# Patient Record
Sex: Male | Born: 1970 | Race: Black or African American | Hispanic: No | Marital: Single | Smoking: Never smoker
Health system: Southern US, Community
[De-identification: ages and names within clinical notes are randomized; demographics above are authoritative.]

## PROBLEM LIST (undated history)

## (undated) DIAGNOSIS — D696 Thrombocytopenia, unspecified: Secondary | ICD-10-CM

## (undated) HISTORY — PX: APPENDECTOMY: SHX54

---

## 2000-07-16 ENCOUNTER — Emergency Department (HOSPITAL_COMMUNITY): Admission: EM | Admit: 2000-07-16 | Discharge: 2000-07-16 | Payer: Self-pay | Admitting: *Deleted

## 2000-07-18 ENCOUNTER — Emergency Department (HOSPITAL_COMMUNITY): Admission: EM | Admit: 2000-07-18 | Discharge: 2000-07-18 | Payer: Self-pay | Admitting: Emergency Medicine

## 2005-09-22 ENCOUNTER — Encounter: Admission: RE | Admit: 2005-09-22 | Discharge: 2005-09-22 | Payer: Self-pay | Admitting: Internal Medicine

## 2006-05-12 ENCOUNTER — Ambulatory Visit (HOSPITAL_BASED_OUTPATIENT_CLINIC_OR_DEPARTMENT_OTHER): Admission: RE | Admit: 2006-05-12 | Discharge: 2006-05-12 | Payer: Self-pay | Admitting: Urology

## 2006-07-31 IMAGING — US US SCROTUM
1 series · 14 of 25 positions shown · non-contrast
Comparison: None.

CLINICAL DATA: Right scrotal mass.
 SCROTAL ULTRASOUND:
TECHNIQUE: Complete ultrasound examination of the testicles, epididymis, and other scrotal structures was performed.

[Series 1: unknown · 0.10mm/px · 14 of 43 slices shown]
[im 1/43]
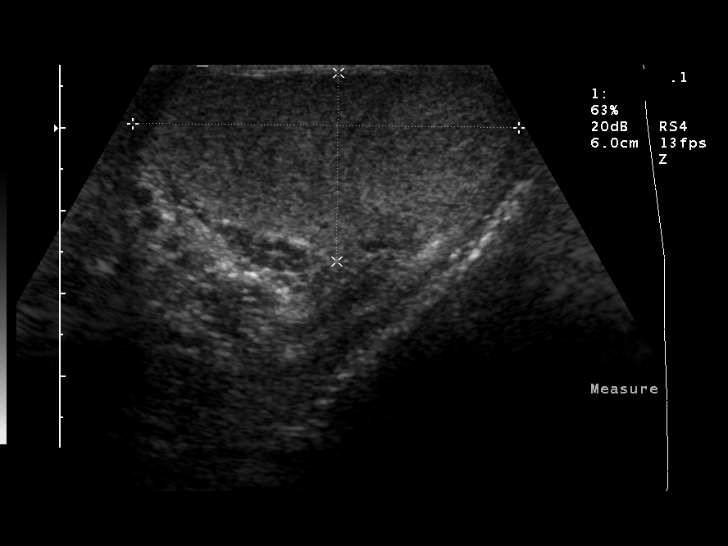
[im 4/43]
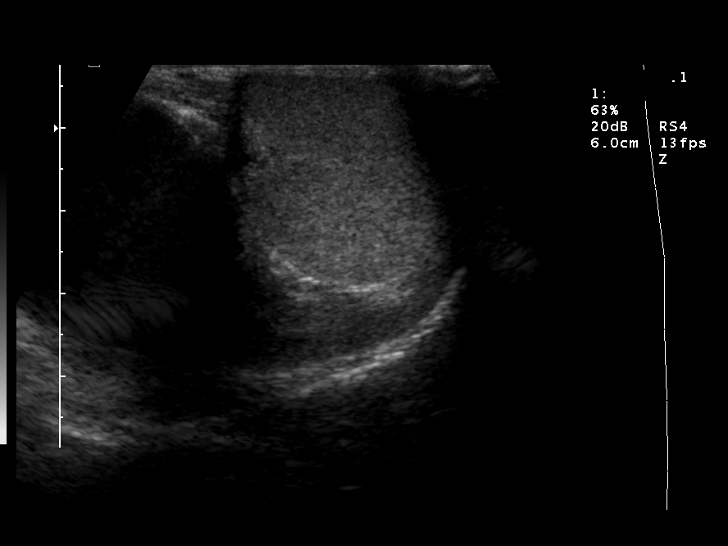
[im 8/43]
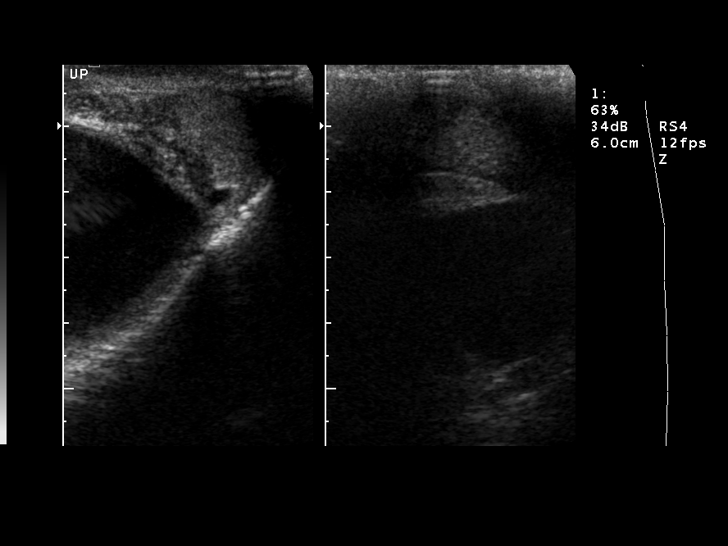
[im 11/43]
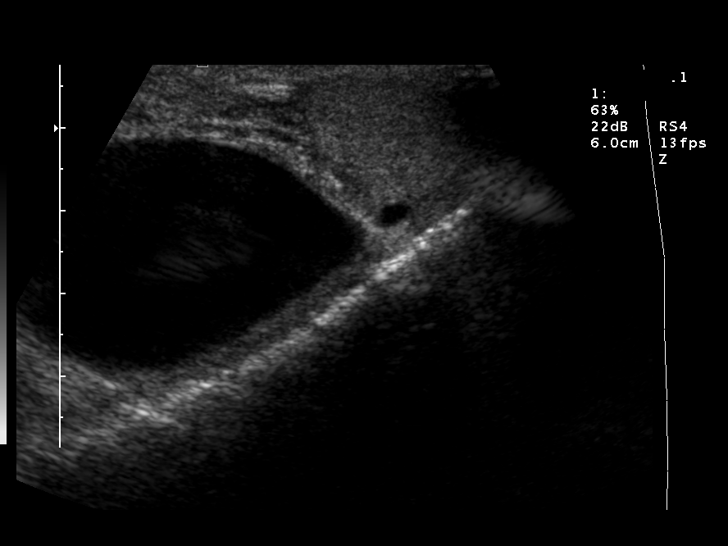
[im 15/43]
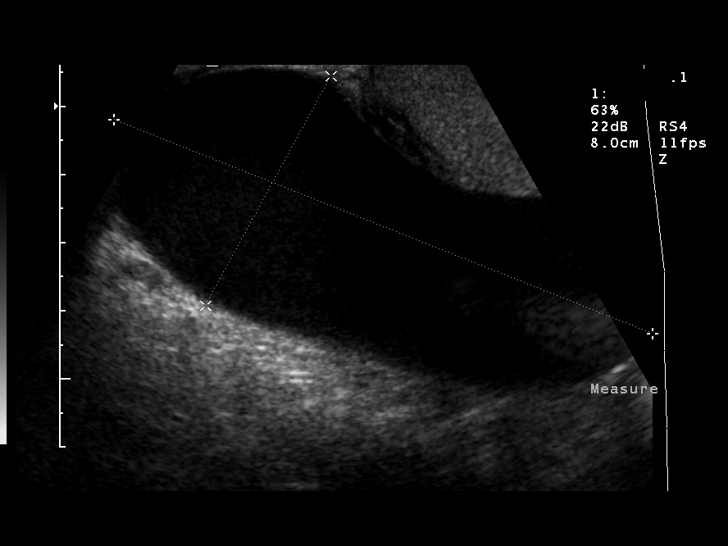
[im 16/43]
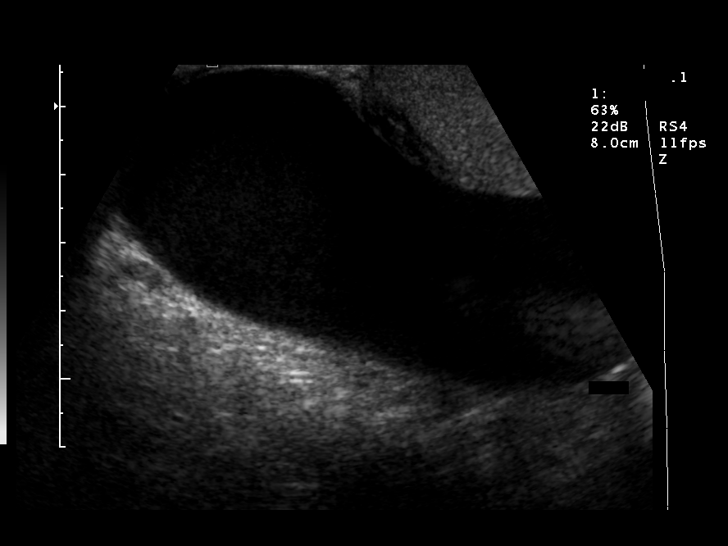
[im 20/43]
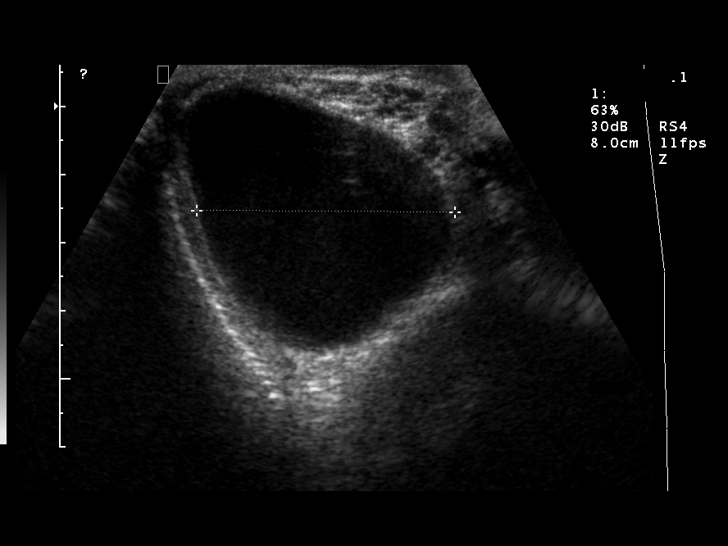
[im 23/43]
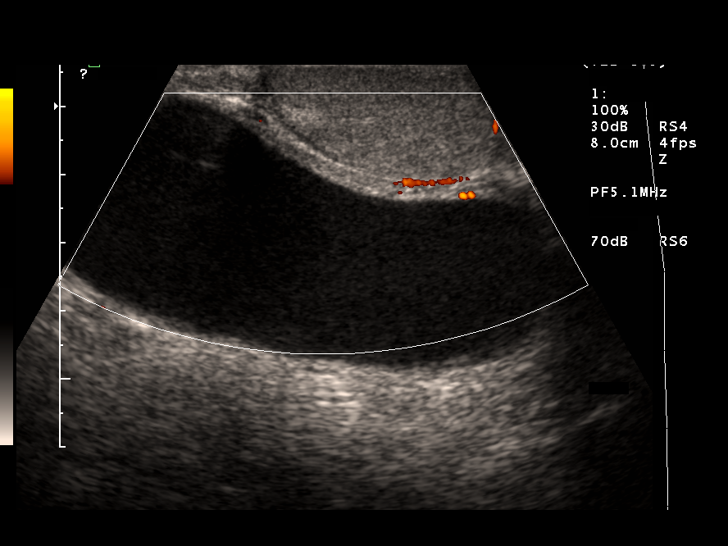
[im 27/43]
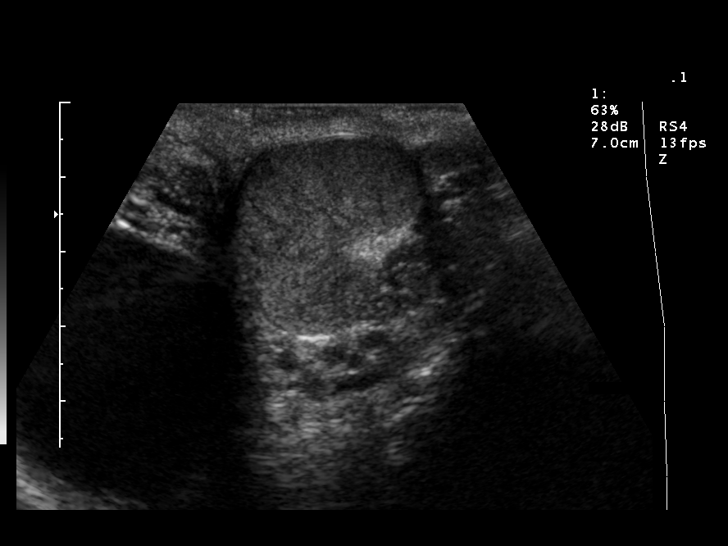
[im 29/43]
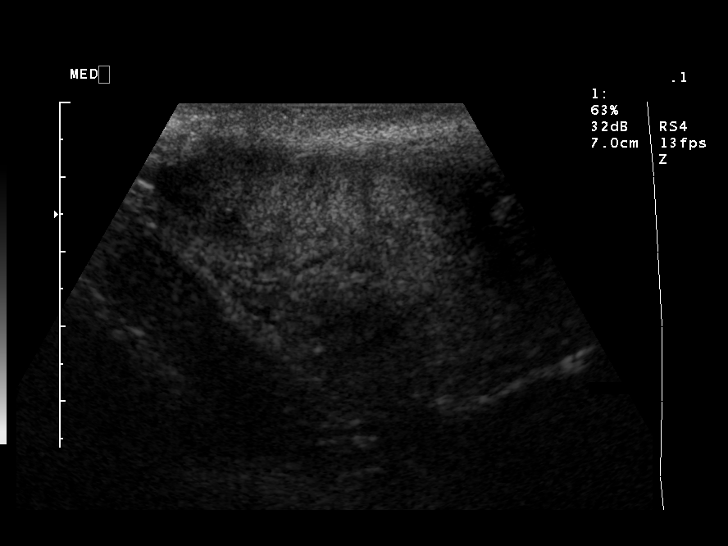
[im 32/43]
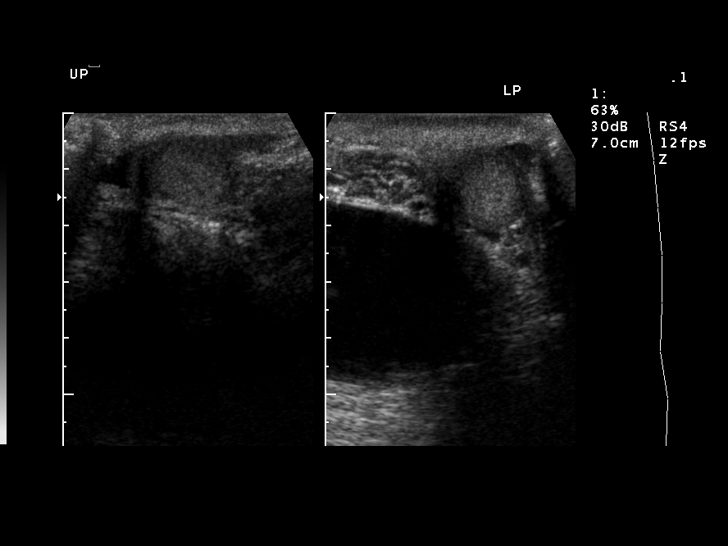
[im 36/43]
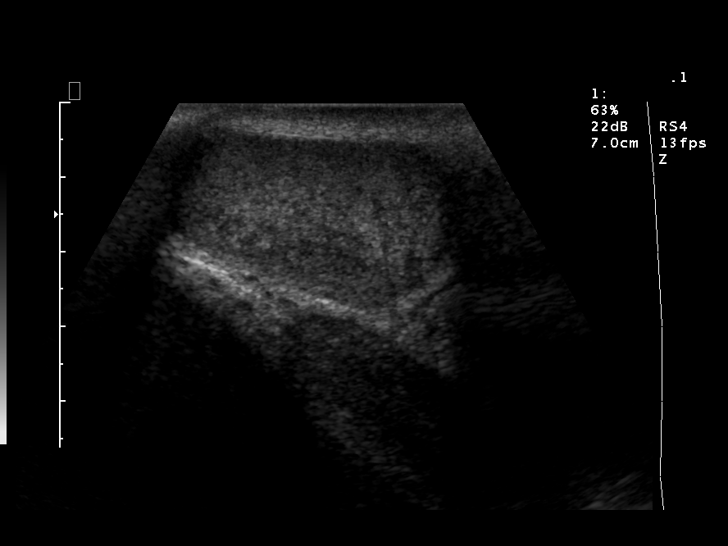
[im 39/43]
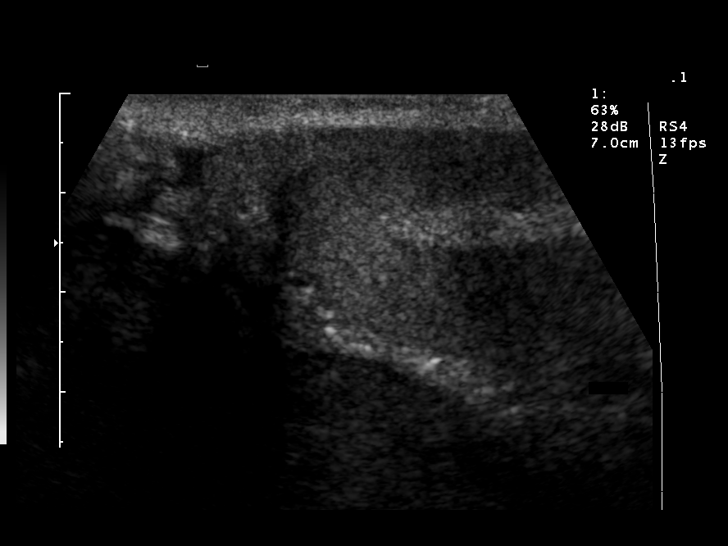
[im 43/43]
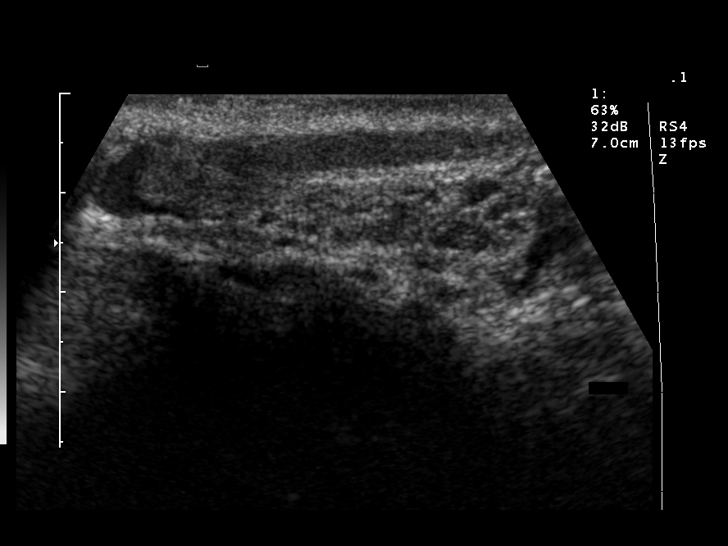

[14 of 25 positions shown; findings below may reference images not displayed]

FINDINGS: The testicles are within normal limits sonographically.  There is symmetrical color Doppler flow.  On the right, there is a large cyst within the scrotal sac measuring 9.0 x 4.1 x 3.8 cm.  The cyst appears to be intimately related to the epididymis.  There is a second smaller cyst measuring only a few millimeters within the epididymis.  The large fluid collection contains echogenic material.  There is no hypervascularity of this lesion.  The most likely diagnosis is a large epididymal cyst/spermatocele.  The left epididymis is normal.  No hydrocele or varicocele.
IMPRESSION: 1.  Large cystic lesion in the right scrotal sac.  The findings are most consistent with a large epididymal cyst/spermatocele.
 2.  Otherwise normal exam.

## 2013-01-09 ENCOUNTER — Other Ambulatory Visit: Payer: Self-pay | Admitting: Internal Medicine

## 2013-01-09 DIAGNOSIS — D179 Benign lipomatous neoplasm, unspecified: Secondary | ICD-10-CM

## 2013-01-18 ENCOUNTER — Emergency Department (HOSPITAL_COMMUNITY)
Admission: EM | Admit: 2013-01-18 | Discharge: 2013-01-18 | Disposition: A | Discharge status: Home / Self Care | Payer: 59 | Source: Home / Self Care | Attending: Family Medicine | Admitting: Family Medicine

## 2013-01-18 ENCOUNTER — Encounter (HOSPITAL_COMMUNITY): Payer: Self-pay | Admitting: Emergency Medicine

## 2013-01-18 DIAGNOSIS — Z711 Person with feared health complaint in whom no diagnosis is made: Secondary | ICD-10-CM

## 2013-01-18 DIAGNOSIS — T6391XA Toxic effect of contact with unspecified venomous animal, accidental (unintentional), initial encounter: Secondary | ICD-10-CM

## 2013-01-18 NOTE — ED Provider Notes (Signed)
   History    CSN: [id number] Arrival date & time 01/18/13  1129  First MD Initiated Contact with Patient 01/18/13 1325     Chief Complaint  Patient presents with  . Snake Bite   (Consider location/radiation/quality/duration/timing/severity/associated sxs/prior Treatment) HPI Comments: 42 year old male with no significant past medical history. Here concerned about a possible snake bite in his right lower leg. Patient stated he was getting his grill already today when he fell like a mosquito bites in his right lower leg liver short after he sold a snake coming out of the area where he had been. There no skin rashes or wounds. This event occurred at around 10 AM this morning (about 4 hours ago)   History reviewed. No pertinent past medical history. Past Surgical History  Procedure Laterality Date  . Appendectomy     No family history on file. History  Substance Use Topics  . Smoking status: Never Smoker   . Smokeless tobacco: Not on file  . Alcohol Use: No    Review of Systems  Constitutional: Negative for fever, chills, diaphoresis, appetite change and fatigue.  Respiratory: Negative for cough, shortness of breath and wheezing.   Skin: Negative for rash and wound.  Neurological: Negative for dizziness and headaches.  All other systems reviewed and are negative.    Allergies  Review of patient's allergies indicates no known allergies.  Home Medications  No current outpatient prescriptions on file. BP 132/86  Pulse 62  Temp(Src) 97.9 F (36.6 C) (Oral)  Resp 16 Physical Exam  Nursing note and vitals reviewed. Constitutional: He is oriented to person, place, and time. He appears well-developed and well-nourished. No distress.  HENT:  Head: Normocephalic and atraumatic.  Mouth/Throat: Oropharynx is clear and moist.  No pharyngeal edema.  Eyes: Conjunctivae and EOM are normal. Pupils are equal, round, and reactive to light. No scleral icterus.  Neck: Neck supple. No JVD  present.  Cardiovascular: Normal rate, regular rhythm, normal heart sounds and intact distal pulses.  Exam reveals no gallop and no friction rub.   No murmur heard. Pulmonary/Chest: Effort normal and breath sounds normal. No respiratory distress. He has no wheezes. He has no rales. He exhibits no tenderness.  Abdominal: Soft. Bowel sounds are normal. He exhibits no distension. There is no tenderness.  Lymphadenopathy:    He has no cervical adenopathy.  Neurological: He is alert and oriented to person, place, and time.  Skin: No rash noted. He is not diaphoretic. No erythema.  No wounds or skin brakes    ED Course  Procedures (including critical care time) Labs Reviewed - No data to display No results found. 1. Physically well but worried     MDM  Normal exam. No skin lesions, rashes or wounds. No prescriptions. Red flags that should prompt his return to medical attention discussed with patient and provided in writing.  Sharin Grave, MD 01/20/13 [phone]

## 2013-01-18 NOTE — ED Notes (Signed)
Asked to assess this patient on arrival for snake bite. Patient states he felt a insect bite type sensation in yard, and then saw a snake slithering away from him . Did NOT WITENSS SNAKE BITING HIM . No break in skin or swelling in extremity noted on inspection. NAD at present

## 2013-01-18 NOTE — ED Notes (Signed)
Patient reports being outside today, walked into a grassy area and then felt what he thought was a mosquito bite on right, lateral ankle .  A short time later he noticed a snake coming out of area he had been in when he felt mosquito bite.  Patient concerned he was bit by a snake.  No visible wound to ankle

## 2013-01-22 ENCOUNTER — Ambulatory Visit
Admission: RE | Admit: 2013-01-22 | Discharge: 2013-01-22 | Disposition: A | Discharge status: Home / Self Care | Payer: 59 | Source: Ambulatory Visit | Attending: Internal Medicine | Admitting: Internal Medicine

## 2013-01-22 DIAGNOSIS — D179 Benign lipomatous neoplasm, unspecified: Secondary | ICD-10-CM

## 2013-01-23 ENCOUNTER — Other Ambulatory Visit: Payer: 59

## 2014-06-15 ENCOUNTER — Encounter (HOSPITAL_COMMUNITY): Payer: Self-pay | Admitting: Emergency Medicine

## 2014-06-15 ENCOUNTER — Encounter: Payer: 59 | Admitting: Hematology

## 2014-06-15 ENCOUNTER — Inpatient Hospital Stay (HOSPITAL_COMMUNITY)
Admission: EM | Admit: 2014-06-15 | Discharge: 2014-06-23 | DRG: 546 | Disposition: A | Discharge status: Home / Self Care | Payer: 59 | Attending: Internal Medicine | Admitting: Internal Medicine

## 2014-06-15 DIAGNOSIS — R509 Fever, unspecified: Secondary | ICD-10-CM

## 2014-06-15 DIAGNOSIS — D509 Iron deficiency anemia, unspecified: Secondary | ICD-10-CM | POA: Insufficient documentation

## 2014-06-15 DIAGNOSIS — N179 Acute kidney failure, unspecified: Secondary | ICD-10-CM | POA: Diagnosis present

## 2014-06-15 DIAGNOSIS — R112 Nausea with vomiting, unspecified: Secondary | ICD-10-CM | POA: Diagnosis not present

## 2014-06-15 DIAGNOSIS — D696 Thrombocytopenia, unspecified: Secondary | ICD-10-CM | POA: Diagnosis present

## 2014-06-15 DIAGNOSIS — Z452 Encounter for adjustment and management of vascular access device: Secondary | ICD-10-CM

## 2014-06-15 DIAGNOSIS — M6282 Rhabdomyolysis: Secondary | ICD-10-CM | POA: Diagnosis not present

## 2014-06-15 DIAGNOSIS — M3119 Other thrombotic microangiopathy: Secondary | ICD-10-CM

## 2014-06-15 DIAGNOSIS — Z992 Dependence on renal dialysis: Secondary | ICD-10-CM

## 2014-06-15 DIAGNOSIS — Z79899 Other long term (current) drug therapy: Secondary | ICD-10-CM

## 2014-06-15 DIAGNOSIS — M311 Thrombotic microangiopathy: Principal | ICD-10-CM | POA: Diagnosis present

## 2014-06-15 LAB — CBC WITH DIFFERENTIAL/PLATELET
BASOS ABS: 0 10*3/uL (ref 0.0–0.1)
Basophils Relative: 0 % (ref 0–1)
EOS ABS: 0 10*3/uL (ref 0.0–0.7)
Eosinophils Relative: 0 % (ref 0–5)
HEMATOCRIT: 32.5 % — AB (ref 39.0–52.0)
Hemoglobin: 10.9 g/dL — ABNORMAL LOW (ref 13.0–17.0)
Lymphocytes Relative: 28 % (ref 12–46)
Lymphs Abs: 2.3 10*3/uL (ref 0.7–4.0)
MCH: 26.7 pg (ref 26.0–34.0)
MCHC: 33.5 g/dL (ref 30.0–36.0)
MCV: 79.7 fL (ref 78.0–100.0)
Monocytes Absolute: 1.1 10*3/uL — ABNORMAL HIGH (ref 0.1–1.0)
Monocytes Relative: 13 % — ABNORMAL HIGH (ref 3–12)
NEUTROS ABS: 4.7 10*3/uL (ref 1.7–7.7)
Neutrophils Relative %: 59 % (ref 43–77)
Platelets: 9 10*3/uL — CL (ref 150–400)
RBC: 4.08 MIL/uL — ABNORMAL LOW (ref 4.22–5.81)
RDW: 15.2 % (ref 11.5–15.5)
WBC: 8.1 10*3/uL (ref 4.0–10.5)

## 2014-06-15 LAB — URINALYSIS, ROUTINE W REFLEX MICROSCOPIC
Bilirubin Urine: NEGATIVE
GLUCOSE, UA: NEGATIVE mg/dL
Ketones, ur: NEGATIVE mg/dL
LEUKOCYTES UA: NEGATIVE
Nitrite: NEGATIVE
Protein, ur: 100 mg/dL — AB
SPECIFIC GRAVITY, URINE: 1.014 (ref 1.005–1.030)
Urobilinogen, UA: 0.2 mg/dL (ref 0.0–1.0)
pH: 5.5 (ref 5.0–8.0)

## 2014-06-15 LAB — HEPATIC FUNCTION PANEL
ALT: 57 U/L — ABNORMAL HIGH (ref 0–53)
AST: 165 U/L — AB (ref 0–37)
Albumin: 4 g/dL (ref 3.5–5.2)
Alkaline Phosphatase: 33 U/L — ABNORMAL LOW (ref 39–117)
BILIRUBIN DIRECT: 0.4 mg/dL — AB (ref 0.0–0.3)
BILIRUBIN INDIRECT: 2.7 mg/dL — AB (ref 0.3–0.9)
BILIRUBIN TOTAL: 3.1 mg/dL — AB (ref 0.3–1.2)
Total Protein: 7.3 g/dL (ref 6.0–8.3)

## 2014-06-15 LAB — FIBRINOGEN: Fibrinogen: 472 mg/dL (ref 204–475)

## 2014-06-15 LAB — BASIC METABOLIC PANEL
Anion gap: 14 (ref 5–15)
BUN: 49 mg/dL — AB (ref 6–23)
CALCIUM: 9.7 mg/dL (ref 8.4–10.5)
CO2: 26 meq/L (ref 19–32)
Chloride: 100 mEq/L (ref 96–112)
Creatinine, Ser: 2.68 mg/dL — ABNORMAL HIGH (ref 0.50–1.35)
GFR calc Af Amer: 32 mL/min — ABNORMAL LOW (ref >90)
GFR, EST NON AFRICAN AMERICAN: 28 mL/min — AB (ref >90)
GLUCOSE: 103 mg/dL — AB (ref 70–99)
Potassium: 3.8 mEq/L (ref 3.7–5.3)
Sodium: 140 mEq/L (ref 137–147)

## 2014-06-15 LAB — CBC
HEMATOCRIT: 33.7 % — AB (ref 39.0–52.0)
Hemoglobin: 11.2 g/dL — ABNORMAL LOW (ref 13.0–17.0)
MCH: 26.5 pg (ref 26.0–34.0)
MCHC: 33.2 g/dL (ref 30.0–36.0)
MCV: 79.7 fL (ref 78.0–100.0)
PLATELETS: 9 10*3/uL — AB (ref 150–400)
RBC: 4.23 MIL/uL (ref 4.22–5.81)
RDW: 15.4 % (ref 11.5–15.5)
WBC: 7.7 10*3/uL (ref 4.0–10.5)

## 2014-06-15 LAB — CK: Total CK: 5214 U/L — ABNORMAL HIGH (ref 7–232)

## 2014-06-15 LAB — PROTIME-INR
INR: 1.08 (ref 0.00–1.49)
Prothrombin Time: 14.1 seconds (ref 11.6–15.2)

## 2014-06-15 LAB — D-DIMER, QUANTITATIVE: D-Dimer, Quant: 2.89 ug/mL-FEU — ABNORMAL HIGH (ref 0.00–0.48)

## 2014-06-15 LAB — URINE MICROSCOPIC-ADD ON

## 2014-06-15 LAB — SEDIMENTATION RATE: SED RATE: 28 mm/h — AB (ref 0–16)

## 2014-06-15 LAB — APTT: APTT: 29 s (ref 24–37)

## 2014-06-15 LAB — LACTATE DEHYDROGENASE: LDH: 1528 U/L — AB (ref 94–250)

## 2014-06-15 MED ORDER — ONDANSETRON HCL 4 MG/2ML IJ SOLN
4.0000 mg | Freq: Once | INTRAMUSCULAR | Status: AC
  Administered 2014-06-15: 4 mg via INTRAVENOUS
  Filled 2014-06-15: qty 2

## 2014-06-15 MED ORDER — ACETAMINOPHEN 325 MG PO TABS
650.0000 mg | ORAL_TABLET | Freq: Four times a day (QID) | ORAL | Status: DC | PRN
  Administered 2014-06-15: 650 mg via ORAL
  Filled 2014-06-15: qty 2

## 2014-06-15 MED ORDER — PANTOPRAZOLE SODIUM 40 MG IV SOLR
40.0000 mg | INTRAVENOUS | Status: DC
  Administered 2014-06-15 – 2014-06-19 (×5): 40 mg via INTRAVENOUS
  Filled 2014-06-15 (×6): qty 40

## 2014-06-15 MED ORDER — SUCRALFATE 1 GM/10ML PO SUSP
1.0000 g | Freq: Four times a day (QID) | ORAL | Status: DC
  Administered 2014-06-15 – 2014-06-23 (×28): 1 g via ORAL
  Filled 2014-06-15 (×34): qty 10

## 2014-06-15 MED ORDER — SODIUM CHLORIDE 0.9 % IV BOLUS (SEPSIS)
1000.0000 mL | Freq: Once | INTRAVENOUS | Status: AC
  Administered 2014-06-15: 1000 mL via INTRAVENOUS

## 2014-06-15 MED ORDER — ACETAMINOPHEN 650 MG RE SUPP: 650.0000 mg | Freq: Four times a day (QID) | RECTAL | Status: DC | PRN

## 2014-06-15 NOTE — Progress Notes (Signed)
[place] NOTE  Patient Care Team: Philis Fendt, MD as PCP - General (Internal Medicine)  CHIEF COMPLAINTS/PURPOSE OF CONSULTATION:  Thrombocytopenia   HISTORY OF PRESENTING ILLNESS:  Kelly Bright 43 y.o. male w/o significant past medical history, who presented to ED with fatigue and extremity numbness for one day. I was called by the ED physician to evaluate his severe thrombocytopenia.  He states he has been having mild anemia over a year, and he has been taking oral iron which was recommended by his primary care physician. He took 2 pills of over-the-counter supplements yesterday, which contains high-dose caffeine, and then went to [employer] for workout. He started his feeling fatigue, lightheaded, and intermittent numbness on fingers and toes, headaches, and nausea and vomiting last night. He had some chills but no fever. He noticed some bloody blister in the mouth this morning and presented to the emergency room today. His CBC showed platelet count 9k, Hemoglobin 10.9, and the peripheral smear showed positive schistocytes. He denies any episodes of confusion, or local weakness.  MEDICAL HISTORY:  No past medical history on file.  SURGICAL HISTORY: Past Surgical History  Procedure Laterality Date  . Appendectomy      SOCIAL HISTORY: History   Social History  . Marital Status: Single    Spouse Name: N/A    Number of Children: N/A  . Years of Education: N/A   Occupational History  . Not on file.   Social History Main Topics  . Smoking status: Never Smoker   . Smokeless tobacco: Not on file  . Alcohol Use: No  . Drug Use: No  . Sexual Activity: Not on file   Other Topics Concern  . Not on file   Social History Narrative    FAMILY HISTORY: No family history on file.  ALLERGIES:  has No Known Allergies.  MEDICATIONS:  No current facility-administered medications for this visit.   Current Outpatient Prescriptions  Medication Sig Dispense  Refill  . Ascorbic Acid (VITAMIN C) 500 MG CHEW Chew 1 tablet by mouth daily.    . cholecalciferol (VITAMIN D) 1000 UNITS tablet Take 1,000 Units by mouth daily.    Marland Kitchen glucosamine-chondroitin 500-400 MG tablet Take 1 tablet by mouth 2 (two) times daily.    . Iron TABS Take 1 tablet by mouth daily.    . Nutritional Supplements (ANTIOXIDANTS PO) Take 2 tablets by mouth daily as needed (nutritional purposes).     Facility-Administered Medications Ordered in Other Visits  Medication Dose Route Frequency Provider Last Rate Last Dose  . acetaminophen (TYLENOL) tablet 650 mg  650 mg Oral Q6H PRN Theressa Millard, MD   650 mg at 06/15/14 2334   Or  . acetaminophen (TYLENOL) suppository 650 mg  650 mg Rectal Q6H PRN Theressa Millard, MD      . pantoprazole (PROTONIX) injection 40 mg  40 mg Intravenous Q24H Theressa Millard, MD   40 mg at 06/15/14 2258  . sucralfate (CARAFATE) 1 GM/10ML suspension 1 g  1 g Oral 4 times per day Theressa Millard, MD   1 g at 06/15/14 2256    REVIEW OF SYSTEMS:   Constitutional: Denies fevers, chills or abnormal night sweats, positive for fatigue Eyes: Denies blurriness of vision, double vision or watery eyes Ears, nose, mouth, throat, and face: Denies mucositis or sore throat Respiratory: Denies cough, dyspnea or wheezes Cardiovascular: Denies palpitation, chest discomfort or lower extremity swelling Gastrointestinal:  Positive for intermittent nausea and  vomiting, no heartburn or change in bowel habits Skin: Denies abnormal skin rashes Lymphatics: Denies new lymphadenopathy or easy bruising Neurological: Intermittent numbness of feet and hands, no tingling or new weaknesses Behavioral/Psych: Mood is stable, no new changes  All other systems were reviewed with the patient and are negative.  PHYSICAL EXAMINATION: His vital sign are normal in the ED GENERAL:alert, no distress and comfortable SKIN: skin color, texture, turgor are normal, no rashes or  significant lesions EYES: normal, conjunctiva are pink and non-injected, sclera clear OROPHARYNX:no exudate, no erythema and lips, buccal mucosa, and tongue normal, I feel bloody blister on the bilateral mucousa NECK: supple, thyroid normal size, non-tender, without nodularity LYMPH:  no palpable lymphadenopathy in the cervical, axillary or inguinal LUNGS: clear to auscultation and percussion with normal breathing effort HEART: regular rate & rhythm and no murmurs and no lower extremity edema ABDOMEN:abdomen soft, non-tender and normal bowel sounds Musculoskeletal:no cyanosis of digits and no clubbing  PSYCH: alert & oriented x 3 with fluent speech NEURO: no focal motor/sensory deficits  LABORATORY DATA:  I have reviewed the data as listed Recent Results (from the past 2160 hour(s))  CBC with Differential     Status: Abnormal   Collection Time: 06/15/14  6:39 PM  Result Value Ref Range   WBC 8.1 4.0 - 10.5 K/uL   RBC 4.08 (L) 4.22 - 5.81 MIL/uL   Hemoglobin 10.9 (L) 13.0 - 17.0 g/dL   HCT 32.5 (L) 39.0 - 52.0 %   MCV 79.7 78.0 - 100.0 fL   MCH 26.7 26.0 - 34.0 pg   MCHC 33.5 30.0 - 36.0 g/dL   RDW 15.2 11.5 - 15.5 %   Platelets 9 (LL) 150 - 400 K/uL    Comment: RESULT REPEATED AND VERIFIED SPECIMEN CHECKED FOR CLOTS PLATELET COUNT CONFIRMED BY SMEAR CRITICAL RESULT CALLED TO, READ BACK BY AND VERIFIED WITH: NEILSON,T AT 1945 ON [id number] BY HOOKER,B    Neutrophils Relative % 59 43 - 77 %   Lymphocytes Relative 28 12 - 46 %   Monocytes Relative 13 (H) 3 - 12 %   Eosinophils Relative 0 0 - 5 %   Basophils Relative 0 0 - 1 %   Neutro Abs 4.7 1.7 - 7.7 K/uL   Lymphs Abs 2.3 0.7 - 4.0 K/uL   Monocytes Absolute 1.1 (H) 0.1 - 1.0 K/uL   Eosinophils Absolute 0.0 0.0 - 0.7 K/uL   Basophils Absolute 0.0 0.0 - 0.1 K/uL   RBC Morphology SCHISTOCYTES PRESENT (2-5/hpf)     Comment: POLYCHROMASIA PRESENT   WBC Morphology MILD LEFT SHIFT (1-5% METAS, OCC MYELO, OCC BANDS)   Basic metabolic  panel     Status: Abnormal   Collection Time: 06/15/14  6:39 PM  Result Value Ref Range   Sodium 140 137 - 147 mEq/L   Potassium 3.8 3.7 - 5.3 mEq/L   Chloride 100 96 - 112 mEq/L   CO2 26 19 - 32 mEq/L   Glucose, Bld 103 (H) 70 - 99 mg/dL   BUN 49 (H) 6 - 23 mg/dL   Creatinine, Ser 2.68 (H) 0.50 - 1.35 mg/dL   Calcium 9.7 8.4 - 10.5 mg/dL   GFR calc non Af Amer 28 (L) >90 mL/min   GFR calc Af Amer 32 (L) >90 mL/min    Comment: (NOTE) The eGFR has been calculated using the CKD EPI equation. This calculation has not been validated in all clinical situations. eGFR's persistently <90 mL/min signify possible Chronic Kidney Disease.  Anion gap 14 5 - 15  CK     Status: Abnormal   Collection Time: 06/15/14  6:39 PM  Result Value Ref Range   Total CK 5214 (H) 7 - 232 U/L  Urinalysis, Routine w reflex microscopic     Status: Abnormal   Collection Time: 06/15/14  7:39 PM  Result Value Ref Range   Color, Urine AMBER (A) YELLOW    Comment: BIOCHEMICALS MAY BE AFFECTED BY COLOR   APPearance CLEAR CLEAR   Specific Gravity, Urine 1.014 1.005 - 1.030   pH 5.5 5.0 - 8.0   Glucose, UA NEGATIVE NEGATIVE mg/dL   Hgb urine dipstick LARGE (A) NEGATIVE   Bilirubin Urine NEGATIVE NEGATIVE   Ketones, ur NEGATIVE NEGATIVE mg/dL   Protein, ur 100 (A) NEGATIVE mg/dL   Urobilinogen, UA 0.2 0.0 - 1.0 mg/dL   Nitrite NEGATIVE NEGATIVE   Leukocytes, UA NEGATIVE NEGATIVE  Urine microscopic-add on     Status: None   Collection Time: 06/15/14  7:39 PM  Result Value Ref Range   Squamous Epithelial / LPF RARE RARE   WBC, UA 0-2 <3 WBC/hpf   RBC / HPF 0-2 <3 RBC/hpf   Urine-Other AMORPHOUS URATES/PHOSPHATES   Hepatic function panel     Status: Abnormal   Collection Time: 06/15/14  9:11 PM  Result Value Ref Range   Total Protein 7.3 6.0 - 8.3 g/dL   Albumin 4.0 3.5 - 5.2 g/dL   AST 165 (H) 0 - 37 U/L   ALT 57 (H) 0 - 53 U/L   Alkaline Phosphatase 33 (L) 39 - 117 U/L   Total Bilirubin 3.1 (H) 0.3 -  1.2 mg/dL   Bilirubin, Direct 0.4 (H) 0.0 - 0.3 mg/dL   Indirect Bilirubin 2.7 (H) 0.3 - 0.9 mg/dL  Sedimentation rate     Status: Abnormal   Collection Time: 06/15/14  9:11 PM  Result Value Ref Range   Sed Rate 28 (H) 0 - 16 mm/hr  Lactate dehydrogenase     Status: Abnormal   Collection Time: 06/15/14  9:11 PM  Result Value Ref Range   LDH 1528 (H) 94 - 250 U/L  D-dimer, quantitative     Status: Abnormal   Collection Time: 06/15/14  9:11 PM  Result Value Ref Range   D-Dimer, Quant 2.89 (H) 0.00 - 0.48 ug/mL-FEU    Comment:        AT THE INHOUSE ESTABLISHED CUTOFF VALUE OF 0.48 ug/mL FEU, THIS ASSAY HAS BEEN DOCUMENTED IN THE LITERATURE TO HAVE A SENSITIVITY AND NEGATIVE PREDICTIVE VALUE OF AT LEAST 98 TO 99%.  THE TEST RESULT SHOULD BE CORRELATED WITH AN ASSESSMENT OF THE CLINICAL PROBABILITY OF DVT / VTE.   Fibrinogen     Status: None   Collection Time: 06/15/14  9:11 PM  Result Value Ref Range   Fibrinogen 472 204 - 475 mg/dL  Protime-INR     Status: None   Collection Time: 06/15/14  9:11 PM  Result Value Ref Range   Prothrombin Time 14.1 11.6 - 15.2 seconds   INR 1.08 0.00 - 1.49  APTT     Status: None   Collection Time: 06/15/14  9:11 PM  Result Value Ref Range   aPTT 29 24 - 37 seconds  CBC     Status: Abnormal   Collection Time: 06/15/14 10:30 PM  Result Value Ref Range   WBC 7.7 4.0 - 10.5 K/uL   RBC 4.23 4.22 - 5.81 MIL/uL   Hemoglobin 11.2 (L)  13.0 - 17.0 g/dL   HCT 33.7 (L) 39.0 - 52.0 %   MCV 79.7 78.0 - 100.0 fL   MCH 26.5 26.0 - 34.0 pg   MCHC 33.2 30.0 - 36.0 g/dL   RDW 15.4 11.5 - 15.5 %   Platelets 9 (LL) 150 - 400 K/uL    Comment: REPEATED TO VERIFY CRITICAL VALUE NOTED.  VALUE IS CONSISTENT WITH PREVIOUSLY REPORTED AND CALLED VALUE.     RADIOGRAPHIC STUDIES:  ASSESSMENT & PLAN:  43 year old African-American male, without significant past medical history, presented with fatigue, nausea vomiting, headache and extremity numbness, and severe  thrombocytopenia and mild anemia.  1. 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 Giving the severe some cytopenia, anemia with lab evidence of hemolysis ( including high LDH and elevated indirect bilirubin, his haptoglobin is still pending), acute renal failure, neuro symptoms (headache and extremity numbness), this is highly suspicious for TTP. -Please send for ADAMTS 13 level and inhibitor tomorrow -The other differential including DIC, and other etiology of MAHA. He has normal PT, PTT, fibrinogen levels, and d-dimer is not remarkably elevated, which makes DIC is much less likely.  2. Rhabdomyolysis  -Possibly related to his vigorous exercise yesterday  Recommendation  #1 I have spent spoken with [facility] physician Dr. Colin Rhein regarding transfer him to [facility] for catheter placement tonight, and initiating plasma exchange tomorrow morning. Dr. Colin Rhein has contacted renal service, and is a wall started plasmic exchange tomorrow.  #2 start methylprednisolone 125 mg IV every 12 hours. I'll adjust his steroid dose based on his response to plasma exchange. #3 he has no active bleeding, please do not give platelet transfusion, which is relatively contraindicated in TTP management. Consider platelet transfusion only for life-threatening bleeding.   #4 please send ADAMTS 13 activity and inhibitor test tomorrow. #5 consult renal service for plasma exchange.  #6 adequate IV hydration for Rhabdomyolysis  I have discussed the above plan with [facility] on-call physician who agrees.   Please call me at my cell [phone] if you have any questions. I'll follow-up with him during his hospital stay.  All questions were answered. The patient knows to call the clinic with any problems, questions or concerns. I spent 40 minutes counseling the patient face to face. The total time spent in the  appointment was 60 minutes and more than 50% was on counseling.     Truitt Merle, MD 06/15/2014 11:59 PM  This encounter was created in error - please disregard.

## 2014-06-15 NOTE — ED Notes (Signed)
Bed: VP03 Expected date:  Expected time:  Means of arrival:  Comments: Hold for triage 4

## 2014-06-15 NOTE — ED Provider Notes (Signed)
CSN: [id number]     Arrival date & time 06/15/14  1736 History   First MD Initiated Contact with Patient 06/15/14 1758     Chief Complaint  Patient presents with  . Generalized Body Aches     (Consider location/radiation/quality/duration/timing/severity/associated sxs/prior Treatment) Patient is a 43 y.o. male presenting with general illness.  Illness Location:  Generalized Quality:  Tingling, mild numbness Severity:  Moderate Onset quality:  Gradual Duration:  1 day Timing:  Constant Progression:  Worsening Chronicity:  New Context:  Took caffeine weight loss supplement last night with 270 mg caffeine Relieved by:  Nothing Worsened by:  Nothing Associated symptoms: nausea and vomiting   Associated symptoms: no abdominal pain, no cough, no fever, no loss of consciousness and no wheezing   Associated symptoms comment:  Dark urine   History reviewed. No pertinent past medical history. Past Surgical History  Procedure Laterality Date  . Appendectomy     No family history on file. History  Substance Use Topics  . Smoking status: Never Smoker   . Smokeless tobacco: Not on file  . Alcohol Use: No    Review of Systems  Constitutional: Negative for fever.  Respiratory: Negative for cough and wheezing.   Gastrointestinal: Positive for nausea and vomiting. Negative for abdominal pain.  Neurological: Negative for loss of consciousness.  All other systems reviewed and are negative.     Allergies  Review of patient's allergies indicates no known allergies.  Home Medications   Prior to Admission medications   Medication Sig Start Date End Date Taking? Authorizing Provider  Ascorbic Acid (VITAMIN C) 500 MG CHEW Chew 1 tablet by mouth daily.   Yes Historical Provider, MD  cholecalciferol (VITAMIN D) 1000 UNITS tablet Take 1,000 Units by mouth daily.   Yes Historical Provider, MD  glucosamine-chondroitin 500-400 MG tablet Take 1 tablet by mouth 2 (two) times daily.   Yes  Historical Provider, MD  Iron TABS Take 1 tablet by mouth daily.   Yes Historical Provider, MD  Nutritional Supplements (ANTIOXIDANTS PO) Take 2 tablets by mouth daily as needed (nutritional purposes).   Yes Historical Provider, MD   BP 139/78 mmHg  Pulse 75  Temp(Src) 98.1 F (36.7 C) (Oral)  Resp 20  SpO2 98% Physical Exam  Constitutional: He is oriented to person, place, and time. He appears well-developed and well-nourished.  HENT:  Head: Normocephalic and atraumatic.  Eyes: Conjunctivae and EOM are normal.  Neck: Normal range of motion. Neck supple.  Cardiovascular: Normal rate, regular rhythm and normal heart sounds.   Pulmonary/Chest: Effort normal and breath sounds normal. No respiratory distress.  Abdominal: He exhibits no distension. There is no tenderness. There is no rebound and no guarding.  Musculoskeletal: Normal range of motion.  Neurological: He is alert and oriented to person, place, and time.  Skin: Skin is warm and dry.  Vitals reviewed.   ED Course  Procedures (including critical care time) Labs Review Labs Reviewed  CBC WITH DIFFERENTIAL - Abnormal; Notable for the following:    RBC 4.08 (*)    Hemoglobin 10.9 (*)    HCT 32.5 (*)    Platelets 9 (*)    Monocytes Relative 13 (*)    Monocytes Absolute 1.1 (*)    All other components within normal limits  BASIC METABOLIC PANEL - Abnormal; Notable for the following:    Glucose, Bld 103 (*)    BUN 49 (*)    Creatinine, Ser 2.68 (*)    GFR calc non  Af Amer 28 (*)    GFR calc Af Amer 32 (*)    All other components within normal limits  CK - Abnormal; Notable for the following:    Total CK 5214 (*)    All other components within normal limits  URINALYSIS, ROUTINE W REFLEX MICROSCOPIC    Imaging Review No results found.   EKG Interpretation   Date/Time:  Sunday June 15 2014 18:23:17 EST Ventricular Rate:  76 PR Interval:  155 QRS Duration: 69 QT Interval:  368 QTC Calculation: 414 R Axis:    64 Text Interpretation:  Sinus rhythm No old tracing to compare Confirmed by  Debby Freiberg [phone]) on 06/15/2014 8:00:06 PM      MDM   Final diagnoses:  None    43 y.o. male with pertinent PMH presents with dizziness, lightheadedness, tingling in extremities after taking a large amount of caffeine and working out.  He does not usually take caffeine.  He worked out and came home and had nausea and vomiting with darkened urine.  On arrival today vitals and physical exam as above.  Pt well appearing, endorses that he is able to take PO. NS bolus ordered.    Labs with thrombocytopenia, CK elevated, cr elevated.  Admitted in stable condition.  Thrombocytopenia Rhabdomyolysis    Debby Freiberg, MD 06/15/14 2024

## 2014-06-15 NOTE — ED Notes (Signed)
Dr. Jenkins at bedside. 

## 2014-06-15 NOTE — ED Notes (Signed)
Hematologist at bedside

## 2014-06-15 NOTE — Consult Note (Signed)
PULMONARY / CRITICAL CARE MEDICINE   Name: Kelly Bright MRN: [id number] DOB: August 17, 1970    ADMISSION DATE:  06/15/2014 CONSULTATION DATE:  06/15/2014  REFERRING MD :  Arnoldo Morale  CHIEF COMPLAINT:  Muscle Aches  INITIAL PRESENTATION:  43 y.o. M who presented to [facility] ED 11/29 with significant muscle aches / pains.  He took 2 tablets of OTC weight loss supplement one day prior and proceeded to have a very strenuous workout.  In ED, he was found to have rhabdo and thrombocytopenia (PLT count of 9).  He has also developed blisters in his mouth as well as bruising on arms.  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  He was transferred to [facility] ICU and PCCM was consulted for placement of HD catheter for immediate PLEX.   STUDIES:  None  SIGNIFICANT EVENTS: 11/29 - presented to [facility] with diffuse muscle aches / dark urine, found to have rhabdo + TTP.  Transferred to [facility] 11/30 - PLEX + steroids started   HISTORY OF PRESENT ILLNESS: Kelly Bright is a 43 y.o. M with no significant PMH.  He presented to [facility] ED on 11/29 with diffuse muscle aches / pains as well as dark urine.  One day prior, he reportedly took 2 tables of an OTC weight loss supplement (called thermogenic shred) and proceeded to have a strenuous workout.  Later that night he began to feel light headed with headache, mild nausea and vomiting, and had intermittent numbness in fingers and toes.  The following morning he began to experience the muscle aches which worsened throughout the day.  He also began to experience blood blisters in his mouth / gums and noticed bruising to his arms. In, ED, he was found to have thrombocytopenia with PLT count of 9 as well as rhabdomyolysis with CK of 5214.  He was evaluated by heme-onc and they felt that pt had TTP.  He was subsequently transferred to [facility] ICU for placement of HD catheter and immediate PLEX.   PAST MEDICAL HISTORY :   has no past medical history on file.  has past surgical history that  includes Appendectomy. Prior to Admission medications   Medication Sig Start Date End Date Taking? Authorizing Provider  Ascorbic Acid (VITAMIN C) 500 MG CHEW Chew 1 tablet by mouth daily.   Yes Historical Provider, MD  cholecalciferol (VITAMIN D) 1000 UNITS tablet Take 1,000 Units by mouth daily.   Yes Historical Provider, MD  glucosamine-chondroitin 500-400 MG tablet Take 1 tablet by mouth 2 (two) times daily.   Yes Historical Provider, MD  Iron TABS Take 1 tablet by mouth daily.   Yes Historical Provider, MD  Nutritional Supplements (ANTIOXIDANTS PO) Take 2 tablets by mouth daily as needed (nutritional purposes).   Yes Historical Provider, MD   No Known Allergies  FAMILY HISTORY:  No family history on file.  SOCIAL HISTORY:  reports that he has never smoked. He does not have any smokeless tobacco history on file. He reports that he does not drink alcohol or use illicit drugs.  REVIEW OF SYSTEMS:   All negative; except for those that are bolded, which indicate positives.  Constitutional: weight loss, weight gain, night sweats, fevers, chills, fatigue, weakness.  HEENT: headache, sore throat, sneezing, nasal congestion, post nasal drip, difficulty swallowing, tooth/dental problems, visual complaints, visual changes, ear aches, oral / gingival blood blisters. Neuro: difficulty with speech, weakness, numbness, ataxia. CV:  chest pain, orthopnea, PND, swelling in lower extremities, dizziness, palpitations, syncope.  Resp:  cough, hemoptysis, dyspnea, wheezing. GI  heartburn, indigestion, abdominal pain, nausea, vomiting, diarrhea, constipation, change in bowel habits, loss of appetite, hematemesis, melena, hematochezia.  GU: dysuria, change in color of urine, urgency or frequency, flank pain, hematuria. MSK: joint pain or swelling, decreased range of motion, diffuse muscle aches. Psych: change in mood or affect, depression, anxiety, suicidal ideations, homicidal ideations. Skin: rash,  itching, bruising.   SUBJECTIVE: Headache improved.  No complaints, just nervous regarding PLEX as hasn't been through anything like this before.  VITAL SIGNS: Temp:  [98.1 F (36.7 C)] 98.1 F (36.7 C) (11/29 1745) Pulse Rate:  [74-94] 94 (11/29 2335) Resp:  [16-24] 24 (11/29 2335) BP: (137-157)/(74-84) 157/84 mmHg (11/29 2335) SpO2:  [98 %-100 %] 100 % (11/29 2335) HEMODYNAMICS:   VENTILATOR SETTINGS:   INTAKE / OUTPUT: Intake/Output    None     PHYSICAL EXAMINATION: General: WDWN male, in NAD. Neuro: A&O x 3, non-focal.  HEENT: [place]/AT. PERRL, sclerae anicteric. Cardiovascular: RRR, no M/R/G.  Lungs: Respirations even and unlabored.  CTA bilaterally, No W/R/R.  Abdomen: BS x 4, soft, NT/ND.  Musculoskeletal: No gross deformities, no edema.  Skin: Intact, warm, no rashes.  LABS:  CBC  Recent Labs Lab 06/15/14 1839 06/15/14 2230  WBC 8.1 7.7  HGB 10.9* 11.2*  HCT 32.5* 33.7*  PLT 9* 9*   Coag's  Recent Labs Lab 06/15/14 2111  APTT 29  INR 1.08   BMET  Recent Labs Lab 06/15/14 1839  NA 140  K 3.8  CL 100  CO2 26  BUN 49*  CREATININE 2.68*  GLUCOSE 103*   Electrolytes  Recent Labs Lab 06/15/14 1839  CALCIUM 9.7   Sepsis Markers No results for input(s): LATICACIDVEN, PROCALCITON, O2SATVEN in the last 168 hours. ABG No results for input(s): PHART, PCO2ART, PO2ART in the last 168 hours. Liver Enzymes  Recent Labs Lab 06/15/14 2111  AST 165*  ALT 57*  ALKPHOS 33*  BILITOT 3.1*  ALBUMIN 4.0   Cardiac Enzymes No results for input(s): TROPONINI, PROBNP in the last 168 hours. Glucose No results for input(s): GLUCAP in the last 168 hours.  Imaging No results found.   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 Anemia VTE Prophylaxis P:  Place HD catheter tonight for PLEX starting in  AM per heme - onc. Solumedrol 156m q12hrs per heme - onc recs. ADAMTS 13 level in AM. Transfuse per usual ICU guidelines. SCD's only. CBC in AM.  RENAL A:   AKI Rhabdomyolysis - initial CK 5214 P:   Aggressive hydration - received 2L so far.  Will start NS @ 125 now. Nephrology called for PLEX in AM. BMP in AM.  PULMONARY A: No acute issues P:   Monitor clinically.  CARDIOVASCULAR R IJ HD Cath 11/30 >>> A:  No acute issues P:  Monitor clinically.  GASTROINTESTINAL A:   Nausea with vomiting Nutrition P:   Zofran PRN. Advance diet as tolerated.  INFECTIOUS A:   No evidence of infection P:   Monitor clinically.  ENDOCRINE A:   No known issues P:   SSI if glucose consistently > 180.  NEUROLOGIC A:   Pain P:   Oxycodone PRN.   Family updated: None available.  Interdisciplinary Family Meeting v Palliative Care Meeting:  Due by: 12/6.   RMontey Hora PA - [facility] Pgr: [phone]  or [phone] 06/15/2014, 11:55 PM

## 2014-06-15 NOTE — ED Notes (Addendum)
[facility]/o body aches, headache, and dizziness. He reports yesterday he took two diet pills and worked out and played basketball about 4 hours. He also reports his feet, hands, and tongue went numb for a while. Brown urine present. Blisters in mouth that appeared this a.m.

## 2014-06-15 NOTE — H&P (Addendum)
[facility] Admission History and Physical       Kelly Bright ZJI:[id number] DOB: 09-19-1970 DOA: 06/15/2014  Referring physician:  PCP: Philis Fendt, MD  Specialists:   Chief Complaint: Muscle Pain  HPI: Kelly Bright is a 43 y.o. male previously healthy and he decided to take 2 tablets of an OTC Weight Loss Supplement called Thermogenic Shred and and he had a very hard exercise workout yesterday.  Today, he presented to the ED with complaints of diffuse muscle pain, nausea and vomiting and dark urine and was found to have rhabdomyolysis with a total CK of 5214, as well as an elevated BUN/Cr of 49/2.68, and incidentally was found to have a platelet count of 9.    He reports that he began to have blood blisters in his mouth and gums today. He has noticed bruises on his arms.   He denies any cough or SOB or chest Pain.   He denies any melena or hematochezia.     He denies taking increased NSAID, but reports that he had URI symptoms last week and took OTC Tylenol cold and AlkaSeltzer Plus during that time.     Review of Systems:  Constitutional: No Weight Loss, No Weight Gain, Night Sweats, Fevers, Chills, Dizziness, Fatigue, or Generalized Weakness HEENT: No Headaches, Difficulty Swallowing,Tooth/Dental Problems,Sore Throat,  No Sneezing, Rhinitis, Ear Ache, Nasal Congestion, or Post Nasal Drip,  Cardio-vascular:  No Chest pain, Orthopnea, PND, Edema in Lower Extremities, Anasarca, Dizziness, Palpitations  Resp: No Dyspnea, No DOE, No Productive Cough, No Non-Productive Cough, No Hemoptysis, No Wheezing.    GI: No Heartburn, Indigestion, Abdominal Pain, +Nausea, +Vomiting, Diarrhea, Hematemesis, Hematochezia, Melena, Change in Bowel Habits,  Loss of Appetite  GU: No Dysuria, Change in Color of Urine, No Urgency or Frequency, No Flank pain.  Musculoskeletal: +Myalgias, No Decreased Range of Motion, No Back Pain.  Neurologic: No Syncope, No Seizures, Muscle Weakness, Paresthesia,  Vision Disturbance or Loss, No Diplopia, No Vertigo, No Difficulty Walking,  Skin: No Rash or Lesions. Psych: No Change in Mood or Affect, No Depression or Anxiety, No Memory loss, No Confusion, or Hallucinations   History reviewed. No pertinent past medical history.    Past Surgical History  Procedure Laterality Date  . Appendectomy         Prior to Admission medications   Medication Sig Start Date End Date Taking? Authorizing Provider  Ascorbic Acid (VITAMIN C) 500 MG CHEW Chew 1 tablet by mouth daily.   Yes Historical Provider, MD  cholecalciferol (VITAMIN D) 1000 UNITS tablet Take 1,000 Units by mouth daily.   Yes Historical Provider, MD  glucosamine-chondroitin 500-400 MG tablet Take 1 tablet by mouth 2 (two) times daily.   Yes Historical Provider, MD  Iron TABS Take 1 tablet by mouth daily.   Yes Historical Provider, MD  Nutritional Supplements (ANTIOXIDANTS PO) Take 2 tablets by mouth daily as needed (nutritional purposes).   Yes Historical Provider, MD      No Known Allergies   Social History:  reports that he has never smoked. He does not have any smokeless tobacco history on file. He reports that he does not drink alcohol or use illicit drugs.     Family History:     Mother - HTN   Father died of Prostate Cancer age 73   Physical Exam:  GEN:  Pleasant  Well Nourished and Well Developed   43 y.o.African American male examined  and in no acute distress; cooperative with exam Filed  Vitals:   06/15/14 1745 06/15/14 2054  BP: 139/78 137/74  Pulse: 75 74  Temp: 98.1 F (36.7 C)   TempSrc: Oral   Resp: 20 16  SpO2: 98% 98%   Blood pressure 137/74, pulse 74, temperature 98.1 F (36.7 C), temperature source Oral, resp. rate 16, SpO2 98 %. PSYCH: He is alert and oriented x4; does not appear anxious does not appear depressed; affect is normal HEENT: Normocephalic and Atraumatic, Mucous membranes pink; PERRLA; EOM intact; Fundi:  Benign;  No scleral icterus, Nares:  Patent, Oropharynx: Clear, Fair Dentition,    Neck:  FROM, No Cervical Lymphadenopathy nor Thyromegaly or Carotid Bruit; No JVD; Breasts:: Not examined CHEST WALL: No tenderness CHEST: Normal respiration, clear to auscultation bilaterally HEART: Regular rate and rhythm; no murmurs rubs or gallops BACK: No kyphosis or scoliosis; No CVA tenderness ABDOMEN: Positive Bowel Sounds, Soft Non-Tender; No Masses, No Organomegaly. Rectal Exam: Not done EXTREMITIES: No  Cyanosis, Clubbing, or Edema; No Ulcerations. Genitalia: not examined PULSES: 2+ and symmetric SKIN: Normal hydration no rash or ulceration CNS: Alert and Oriented x 4, No Focal Deficits  Vascular: pulses palpable throughout    Labs on Admission:  Basic Metabolic Panel:  Recent Labs Lab 06/15/14 1839  NA 140  K 3.8  CL 100  CO2 26  GLUCOSE 103*  BUN 49*  CREATININE 2.68*  CALCIUM 9.7   Liver Function Tests:  Recent Labs Lab 06/15/14 2111  AST 165*  ALT 57*  ALKPHOS 33*  BILITOT 3.1*  PROT 7.3  ALBUMIN 4.0   No results for input(s): LIPASE, AMYLASE in the last 168 hours. No results for input(s): AMMONIA in the last 168 hours. CBC:  Recent Labs Lab 06/15/14 1839  WBC 8.1  NEUTROABS 4.7  HGB 10.9*  HCT 32.5*  MCV 79.7  PLT 9*   Cardiac Enzymes:  Recent Labs Lab 06/15/14 1839  CKTOTAL 5214*    BNP (last 3 results) No results for input(s): PROBNP in the last 8760 hours. CBG: No results for input(s): GLUCAP in the last 168 hours.  Radiological Exams on Admission: No results found.     Assessment/Plan:   43 y.o. male with    Principal Problem:    1.   T.T.P. Syndrome/Thrombocytopenia-  LDH, ESR, PT/PTT, Fibrinogen, D-Dimer, and Haptoglobin ordered        Blood Smear revealed +Schistocytes (2-5 per hpf)          Heme/Onc consulted Dr. Burr Medico saw Pt in ED   Transfer to ICU bed at [facility] for Plasmapheresis   PCCM consulted and to place Central line for plasma exchange  Active  Problems:    2.   Rhabdomyolysis   IVFs   Monitor BUN/Cr and CPK Levels      3.   AKI (acute kidney injury)   IVFs   Monitor BUN/Cr Levels     4.   DVT Prophylaxis - why, no platelets   SCDs    Code Status:   FULL CODE    Family Communication:    Family at Bedside Disposition Plan:       Inpatient to ICU Bed  Time spent:  [place] [facility] Pager [phone]  If [place] Please Contact the Day Rounding Team MD for [facility]  If 7PM-7AM, Please Contact Night-Floor Coverage  [email or web address] Password [email or web address] 06/15/2014, 10:11 PM

## 2014-06-16 ENCOUNTER — Encounter: Payer: Self-pay | Admitting: Hematology

## 2014-06-16 ENCOUNTER — Inpatient Hospital Stay (HOSPITAL_COMMUNITY): Payer: 59

## 2014-06-16 DIAGNOSIS — R5383 Other fatigue: Secondary | ICD-10-CM

## 2014-06-16 DIAGNOSIS — D649 Anemia, unspecified: Secondary | ICD-10-CM

## 2014-06-16 DIAGNOSIS — R200 Anesthesia of skin: Secondary | ICD-10-CM

## 2014-06-16 DIAGNOSIS — R51 Headache: Secondary | ICD-10-CM

## 2014-06-16 DIAGNOSIS — R112 Nausea with vomiting, unspecified: Secondary | ICD-10-CM

## 2014-06-16 LAB — POCT I-STAT, CHEM 8
BUN: 41 mg/dL — AB (ref 6–23)
CALCIUM ION: 0.74 mmol/L — AB (ref 1.12–1.23)
CREATININE: 2.2 mg/dL — AB (ref 0.50–1.35)
Chloride: 101 mEq/L (ref 96–112)
GLUCOSE: 95 mg/dL (ref 70–99)
HEMATOCRIT: 24 % — AB (ref 39.0–52.0)
Hemoglobin: 8.2 g/dL — ABNORMAL LOW (ref 13.0–17.0)
POTASSIUM: 3.9 meq/L (ref 3.7–5.3)
Sodium: 148 mEq/L — ABNORMAL HIGH (ref 137–147)
TCO2: 25 mmol/L (ref 0–100)

## 2014-06-16 LAB — RENAL FUNCTION PANEL
Albumin: 3.4 g/dL — ABNORMAL LOW (ref 3.5–5.2)
Anion gap: 14 (ref 5–15)
BUN: 39 mg/dL — AB (ref 6–23)
CALCIUM: 10.1 mg/dL (ref 8.4–10.5)
CHLORIDE: 102 meq/L (ref 96–112)
CO2: 26 mEq/L (ref 19–32)
CREATININE: 2.28 mg/dL — AB (ref 0.50–1.35)
GFR calc Af Amer: 39 mL/min — ABNORMAL LOW (ref >90)
GFR calc non Af Amer: 34 mL/min — ABNORMAL LOW (ref >90)
Glucose, Bld: 163 mg/dL — ABNORMAL HIGH (ref 70–99)
Phosphorus: 1.9 mg/dL — ABNORMAL LOW (ref 2.3–4.6)
Potassium: 3.9 mEq/L (ref 3.7–5.3)
Sodium: 142 mEq/L (ref 137–147)

## 2014-06-16 LAB — CBC
HCT: 28.3 % — ABNORMAL LOW (ref 39.0–52.0)
HEMATOCRIT: 29.3 % — AB (ref 39.0–52.0)
HEMOGLOBIN: 9.3 g/dL — AB (ref 13.0–17.0)
Hemoglobin: 9.6 g/dL — ABNORMAL LOW (ref 13.0–17.0)
MCH: 26.3 pg (ref 26.0–34.0)
MCH: 26 pg (ref 26.0–34.0)
MCHC: 32.9 g/dL (ref 30.0–36.0)
MCHC: 32.8 g/dL (ref 30.0–36.0)
MCV: 80.2 fL (ref 78.0–100.0)
MCV: 79.4 fL (ref 78.0–100.0)
Platelets: 6 10*3/uL — CL (ref 150–400)
RBC: 3.53 MIL/uL — ABNORMAL LOW (ref 4.22–5.81)
RBC: 3.69 MIL/uL — ABNORMAL LOW (ref 4.22–5.81)
RDW: 15.5 % (ref 11.5–15.5)
RDW: 16.1 % — ABNORMAL HIGH (ref 11.5–15.5)
WBC: 6.5 10*3/uL (ref 4.0–10.5)
WBC: 12.3 10*3/uL — AB (ref 4.0–10.5)

## 2014-06-16 LAB — ABO/RH: ABO/RH(D): A POS

## 2014-06-16 LAB — BASIC METABOLIC PANEL
ANION GAP: 14 (ref 5–15)
BUN: 42 mg/dL — AB (ref 6–23)
CO2: 24 mEq/L (ref 19–32)
Calcium: 8.7 mg/dL (ref 8.4–10.5)
Chloride: 106 mEq/L (ref 96–112)
Creatinine, Ser: 2.45 mg/dL — ABNORMAL HIGH (ref 0.50–1.35)
GFR, EST AFRICAN AMERICAN: 36 mL/min — AB (ref >90)
GFR, EST NON AFRICAN AMERICAN: 31 mL/min — AB (ref >90)
Glucose, Bld: 100 mg/dL — ABNORMAL HIGH (ref 70–99)
POTASSIUM: 3.6 meq/L — AB (ref 3.7–5.3)
Sodium: 144 mEq/L (ref 137–147)

## 2014-06-16 LAB — TYPE AND SCREEN
ABO/RH(D): A POS
ANTIBODY SCREEN: NEGATIVE

## 2014-06-16 LAB — GLUCOSE, CAPILLARY
GLUCOSE-CAPILLARY: 112 mg/dL — AB (ref 70–99)
Glucose-Capillary: 108 mg/dL — ABNORMAL HIGH (ref 70–99)

## 2014-06-16 LAB — RAPID URINE DRUG SCREEN, HOSP PERFORMED
Amphetamines: NOT DETECTED
BENZODIAZEPINES: NOT DETECTED
Barbiturates: NOT DETECTED
COCAINE: NOT DETECTED
Opiates: NOT DETECTED
TETRAHYDROCANNABINOL: NOT DETECTED

## 2014-06-16 LAB — C-REACTIVE PROTEIN: CRP: 2.3 mg/dL — AB (ref <0.60)

## 2014-06-16 LAB — MRSA PCR SCREENING: MRSA by PCR: NEGATIVE

## 2014-06-16 LAB — HAPTOGLOBIN

## 2014-06-16 LAB — CK: Total CK: 1439 U/L — ABNORMAL HIGH (ref 7–232)

## 2014-06-16 MED ORDER — OXYCODONE HCL 5 MG PO TABS: 5.0000 mg | ORAL_TABLET | ORAL | Status: DC | PRN

## 2014-06-16 MED ORDER — ALTEPLASE 2 MG IJ SOLR
2.0000 mg | Freq: Once | INTRAMUSCULAR | Status: AC | PRN
  Filled 2014-06-16: qty 2

## 2014-06-16 MED ORDER — ONDANSETRON HCL 4 MG PO TABS: 4.0000 mg | ORAL_TABLET | Freq: Four times a day (QID) | ORAL | Status: DC | PRN

## 2014-06-16 MED ORDER — CALCIUM CARBONATE ANTACID 500 MG PO CHEW
2.0000 | CHEWABLE_TABLET | ORAL | Status: DC
  Administered 2014-06-16 – 2014-06-17 (×2): 400 mg via ORAL
  Filled 2014-06-16 (×2): qty 2

## 2014-06-16 MED ORDER — ANTICOAGULANT SODIUM CITRATE 4% (200MG/5ML) IV SOLN
5.0000 mL | Freq: Once | Status: DC
  Filled 2014-06-16: qty 250

## 2014-06-16 MED ORDER — METHYLPREDNISOLONE SODIUM SUCC 125 MG IJ SOLR
125.0000 mg | Freq: Two times a day (BID) | INTRAMUSCULAR | Status: DC
  Administered 2014-06-16 (×2): 125 mg via INTRAVENOUS
  Filled 2014-06-16 (×3): qty 2

## 2014-06-16 MED ORDER — DIPHENHYDRAMINE HCL 25 MG PO CAPS
25.0000 mg | ORAL_CAPSULE | Freq: Four times a day (QID) | ORAL | Status: DC | PRN
  Administered 2014-06-16: 25 mg via ORAL

## 2014-06-16 MED ORDER — ONDANSETRON HCL 4 MG/2ML IJ SOLN
4.0000 mg | Freq: Four times a day (QID) | INTRAMUSCULAR | Status: DC | PRN
  Administered 2014-06-16 – 2014-06-22 (×7): 4 mg via INTRAVENOUS
  Filled 2014-06-16 (×7): qty 2

## 2014-06-16 MED ORDER — ACETAMINOPHEN 325 MG PO TABS: 650.0000 mg | ORAL_TABLET | ORAL | Status: DC | PRN

## 2014-06-16 MED ORDER — ACD FORMULA A 0.73-2.45-2.2 GM/100ML VI SOLN
Status: AC
  Administered 2014-06-16: 500 mL via INTRAVENOUS
  Filled 2014-06-16: qty 1000

## 2014-06-16 MED ORDER — POTASSIUM CHLORIDE CRYS ER 20 MEQ PO TBCR
40.0000 meq | EXTENDED_RELEASE_TABLET | Freq: Two times a day (BID) | ORAL | Status: DC
  Administered 2014-06-16 – 2014-06-23 (×14): 40 meq via ORAL
  Filled 2014-06-16 (×18): qty 2

## 2014-06-16 MED ORDER — CHLORHEXIDINE GLUCONATE 0.12 % MT SOLN
15.0000 mL | Freq: Two times a day (BID) | OROMUCOSAL | Status: DC
  Administered 2014-06-16: 15 mL via OROMUCOSAL
  Filled 2014-06-16: qty 15

## 2014-06-16 MED ORDER — SODIUM CHLORIDE 0.9 % IV SOLN
4.0000 g | Freq: Once | INTRAVENOUS | Status: DC
  Filled 2014-06-16: qty 40

## 2014-06-16 MED ORDER — ENOXAPARIN SODIUM 150 MG/ML ~~LOC~~ SOLN: 1.0000 mg/kg | Freq: Two times a day (BID) | SUBCUTANEOUS | Status: DC

## 2014-06-16 MED ORDER — DIPHENHYDRAMINE HCL 25 MG PO CAPS: 25.0000 mg | ORAL_CAPSULE | Freq: Four times a day (QID) | ORAL | Status: DC | PRN

## 2014-06-16 MED ORDER — ACD FORMULA A 0.73-2.45-2.2 GM/100ML VI SOLN
500.0000 mL | Status: DC
  Administered 2014-06-16: 500 mL via INTRAVENOUS
  Filled 2014-06-16: qty 500

## 2014-06-16 MED ORDER — CALCIUM CARBONATE ANTACID 500 MG PO CHEW
2.0000 | CHEWABLE_TABLET | ORAL | Status: DC
  Filled 2014-06-16 (×2): qty 2

## 2014-06-16 MED ORDER — SODIUM CHLORIDE 0.9 % IV BOLUS (SEPSIS)
1000.0000 mL | Freq: Once | INTRAVENOUS | Status: AC
  Administered 2014-06-16: 1000 mL via INTRAVENOUS

## 2014-06-16 MED ORDER — DIPHENHYDRAMINE HCL 25 MG PO CAPS
25.0000 mg | ORAL_CAPSULE | Freq: Once | ORAL | Status: AC
  Administered 2014-06-16: 25 mg via ORAL

## 2014-06-16 MED ORDER — DIPHENHYDRAMINE HCL 25 MG PO CAPS
25.0000 mg | ORAL_CAPSULE | Freq: Four times a day (QID) | ORAL | Status: DC | PRN
  Administered 2014-06-17: 25 mg via ORAL

## 2014-06-16 MED ORDER — HEPARIN SODIUM (PORCINE) 1000 UNIT/ML DIALYSIS
1000.0000 [IU] | INTRAMUSCULAR | Status: DC | PRN
  Administered 2014-06-16 (×2): 1200 [IU] via INTRAVENOUS_CENTRAL
  Filled 2014-06-16 (×3): qty 6
  Filled 2014-06-16: qty 3

## 2014-06-16 MED ORDER — ACD FORMULA A 0.73-2.45-2.2 GM/100ML VI SOLN
500.0000 mL | Status: DC
  Filled 2014-06-16: qty 500

## 2014-06-16 MED ORDER — DIPHENHYDRAMINE HCL 25 MG PO CAPS
ORAL_CAPSULE | ORAL | Status: AC
  Administered 2014-06-16: 20:00:00
  Filled 2014-06-16: qty 1

## 2014-06-16 MED ORDER — DIPHENHYDRAMINE HCL 25 MG PO CAPS
ORAL_CAPSULE | ORAL | Status: AC
Start: 2014-06-16 — End: 2014-06-16
  Administered 2014-06-16: 25 mg via ORAL
  Filled 2014-06-16: qty 1

## 2014-06-16 MED ORDER — METHYLPREDNISOLONE SODIUM SUCC 125 MG IJ SOLR
Freq: Two times a day (BID) | INTRAMUSCULAR | Status: DC
  Administered 2014-06-17 – 2014-06-18 (×3): via INTRAVENOUS
  Filled 2014-06-16 (×4): qty 25

## 2014-06-16 MED ORDER — HYDROMORPHONE HCL 1 MG/ML IJ SOLN: 0.5000 mg | INTRAMUSCULAR | Status: DC | PRN

## 2014-06-16 MED ORDER — METHYLPREDNISOLONE SODIUM SUCC 125 MG IJ SOLR
INTRAMUSCULAR | Status: AC
  Administered 2014-06-16: 125 mg via INTRAVENOUS
  Filled 2014-06-16: qty 2

## 2014-06-16 MED ORDER — SODIUM CHLORIDE 0.9 % IV SOLN
4.0000 g | Freq: Once | INTRAVENOUS | Status: AC
  Administered 2014-06-16: 4 g via INTRAVENOUS
  Filled 2014-06-16: qty 40

## 2014-06-16 MED ORDER — CALCIUM CARBONATE ANTACID 500 MG PO CHEW
CHEWABLE_TABLET | ORAL | Status: AC
  Administered 2014-06-16: 400 mg via ORAL
  Filled 2014-06-16: qty 2

## 2014-06-16 MED ORDER — SODIUM CHLORIDE 0.9 % IV SOLN
INTRAVENOUS | Status: DC
  Administered 2014-06-16 – 2014-06-20 (×10): via INTRAVENOUS
  Administered 2014-06-20: 125 mL/h via INTRAVENOUS
  Administered 2014-06-21: 09:00:00 via INTRAVENOUS

## 2014-06-16 MED ORDER — SODIUM CHLORIDE 0.9 % IV SOLN
4.0000 g | Freq: Once | INTRAVENOUS | Status: DC
  Filled 2014-06-16 (×2): qty 40

## 2014-06-16 MED ORDER — CETYLPYRIDINIUM CHLORIDE 0.05 % MT LIQD: 7.0000 mL | Freq: Two times a day (BID) | OROMUCOSAL | Status: DC

## 2014-06-16 MED ORDER — ALUM & MAG HYDROXIDE-SIMETH 200-200-20 MG/5ML PO SUSP
30.0000 mL | Freq: Four times a day (QID) | ORAL | Status: DC | PRN
  Filled 2014-06-16: qty 30

## 2014-06-16 NOTE — Progress Notes (Signed)
CRITICAL VALUE ALERT  Critical value received:  Plt < 5  Date of notification:  06/16/2014   Time of notification: 0810  Critical value read back:Yes.    Nurse who received alert:   Daron Offer  MD notified (1st page):  Dr. Dema Severin (verbally notified on the unit)  Time of first page:  0812  MD notified (2nd page):  Time of second page:  Responding MD:  Dr. Dema Severin (present on the unit)   Time MD responded:  [phone]

## 2014-06-16 NOTE — Progress Notes (Signed)
PULMONARY / CRITICAL CARE MEDICINE   Name: Kelly Bright MRN: [id number] DOB: 1970/12/26    ADMISSION DATE:  06/15/2014 CONSULTATION DATE:  06/16/2014  REFERRING MD:  Arnoldo Morale  CHIEF COMPLAINT:  Muscle Aches  INITIAL PRESENTATION:  43 y.o. M who presented to [facility] ED 11/29 with significant muscle aches / pains.  He took 2 tablets of OTC weight loss supplement one day prior and proceeded to have a very strenuous workout.  In ED, he was found to have rhabdo and thrombocytopenia (PLT count of 9).  He has also developed blisters in his mouth as well as bruising on arms.  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  He was transferred to [facility] ICU and PCCM was consulted for placement of HD catheter for immediate PLEX.   STUDIES:  None  SIGNIFICANT EVENTS: 11/29 - presented to [facility] with diffuse muscle aches / dark urine, found to have rhabdo + TTP.  Transferred to [facility] 11/30 - PLEX + steroids started  SUBJECTIVE:  No issues overnight, pt has no complaints this morning except for pain/stiff neck from catheter. Denies pain. Urine clearing.   VITAL SIGNS: Temp:  [98 F (36.7 C)-98.4 F (36.9 C)] 98 F (36.7 C) (11/30 0406) Pulse Rate:  [71-94] 82 (11/30 0600) Resp:  [11-29] 29 (11/30 0600) BP: (124-157)/(72-93) 157/93 mmHg (11/30 0600) SpO2:  [98 %-100 %] 99 % (11/30 0600) Weight:  [109.6 kg (241 lb 10 oz)] 109.6 kg (241 lb 10 oz) (11/30 0202) HEMODYNAMICS:   VENTILATOR SETTINGS:   INTAKE / OUTPUT: Intake/Output      11/29 0701 - 11/30 0700 11/30 0701 - 12/01 0700   P.O. 240    IV Piggyback 1000    Total Intake(mL/kg) 1240 (11.3)    Urine (mL/kg/hr) 550    Emesis/NG output 100    Total Output 650     Net +590            PHYSICAL EXAMINATION: General: NAD. Neuro: A&O x 3, no focal deficits.  HEENT: [place]/AT. Cardiovascular: RRR, normal s1s2, no M/R/G.  Lungs: CTAB, no w/r/c Abdomen: soft, NT/ND, normal bowel sounds.  Musculoskeletal: No gross deformities, no edema.  Skin:  Intact, warm, no rashes.  LABS:  CBC  Recent Labs Lab 06/15/14 1839 06/15/14 2230 06/16/14 0500  WBC 8.1 7.7 6.5  HGB 10.9* 11.2* 9.3*  HCT 32.5* 33.7* 28.3*  PLT 9* 9* PENDING   Coag's  Recent Labs Lab 06/15/14 2111  APTT 29  INR 1.08   BMET  Recent Labs Lab 06/15/14 1839 06/16/14 0500  NA 140 144  K 3.8 3.6*  CL 100 106  CO2 26 24  BUN 49* 42*  CREATININE 2.68* 2.45*  GLUCOSE 103* 100*   Electrolytes  Recent Labs Lab 06/15/14 1839 06/16/14 0500  CALCIUM 9.7 8.7   Sepsis Markers No results for input(s): LATICACIDVEN, PROCALCITON, O2SATVEN in the last 168 hours. ABG No results for input(s): PHART, PCO2ART, PO2ART in the last 168 hours. Liver Enzymes  Recent Labs Lab 06/15/14 2111  AST 165*  ALT 57*  ALKPHOS 33*  BILITOT 3.1*  ALBUMIN 4.0   Cardiac Enzymes No results for input(s): TROPONINI, PROBNP in the last 168 hours. Glucose  Recent Labs Lab 06/16/14 0200  GLUCAP 112*    Imaging Dg Chest Port 1 View  06/16/2014   CLINICAL DATA:  Status post hemodialysis catheter insertion.  EXAM: PORTABLE CHEST - 1 VIEW  COMPARISON:  None.  FINDINGS: The patient's right IJ line is  seen ending about the proximal SVC.  The lungs are well-aerated and clear. There is no evidence of focal opacification, pleural effusion or pneumothorax.  The cardiomediastinal silhouette is within normal limits. No acute osseous abnormalities are seen.  IMPRESSION: 1. Right IJ line noted ending about the proximal SVC. 2. No acute cardiopulmonary process seen.   Electronically Signed   By: Garald Balding M.D.   On: 06/16/2014 03:04   ASSESSMENT / PLAN:  HEMATOLOGIC A:   TTP - significant thrombocytopenia, anemia, elevated LDH, elevated T bili and indirect bili, elevated D-dimer, + schistocytes on blood smear. Anemia VTE Prophylaxis P:  PLEX starting today per heme-onc. Solumedrol 172m q12hrs per heme-onc recs. ADAMTS 13 level in AM. Transfuse per usual ICU  guidelines. SCD's only. CBC trend  RENAL A:   AKI, Scr improving Rhabdomyolysis - initial CK 5214 P:   NS @ 125 now. BMP trend.  PULMONARY A: No acute issues P:   Monitor clinically.  CARDIOVASCULAR R IJ HD Cath 11/30 >>> A:  No acute issues P:  Monitor clinically.  GASTROINTESTINAL A:   Nausea with vomiting Nutrition P:   Zofran PRN. Advance diet as tolerated.  INFECTIOUS A:   No evidence of infection P:   Monitor clinically.  ENDOCRINE A:   No known issues P:   SSI if glucose consistently > 180.  NEUROLOGIC A:   Pain P:   Oxycodone PRN.  Family updated: None available.  Interdisciplinary Family Meeting v Palliative Care Meeting:  Due by: 12/6.  ATawanna Sat MD 06/16/2014, 7:48 AM PGY-2, [facility]  Reviewed above, examined.  43yo male with no significant PMHx developed severe muscle aches after strenuous exercise and taking weight loss agent (thermogenic shred).  Found to have AKI, thrombocytopenia, hemolytic anemia, and rhabdo.  Concern for TTP.  Has been started on solumedrol, and heme/onc arranging for PLEX.  Will monitor in ICU, f/u CMET, CBC.  Monitor mental status.  CC time by me independent of resident time is 35 minutes.  VChesley Mires MD LAdventist Health [facility] GreenleyPulmonary/Critical Care 06/16/2014, 3:08 PM Pager:  [phone] 3pm call: [phone]

## 2014-06-16 NOTE — Progress Notes (Signed)
LARNELL GRANLUND   DOB:[birth year]   ZO#:[id number]   JWJ#:[id number]  Subjective: I saw him in the dialysis unit when he was on plasma exchange. No episodes of bleeding. Plt<5K this morning, Cr slightly up. No fever or confusion, no other new complains.    Objective:  Filed Vitals:   July 15, 2014 2015  BP: 134/75  Pulse: 99  Temp:   Resp: 23    Body mass index is 29.42 kg/(m^2).  Intake/Output Summary (Last 24 hours) at Jul 15, 2014 2105 Last data filed at 2014-07-15 2059  Gross per 24 hour  Intake   3510 ml  Output   1400 ml  Net   2110 ml     Sclerae unicteric  Oropharynx clear  No peripheral adenopathy             (+) right IJ venous catheter   Lungs clear -- no rales or rhonchi  Heart regular rate and rhythm  Abdomen benign  MSK no focal spinal tenderness, no peripheral edema  Neuro nonfocal   Labs:  Lab Results  Component Value Date   WBC 6.5 07-15-14   HGB 8.2* 07/15/14   HCT 24.0* 2014-07-15   MCV 80.2 07/15/14   PLT <5* July 15, 2014   NEUTROABS 4.7 62/13/0865    Basic Metabolic Panel:  Recent Labs Lab 06/15/14 1839 July 15, 2014 0500 07/15/14 1701  NA 140 144 148*  K 3.8 3.6* 3.9  CL 100 106 101  CO2 26 24  --   GLUCOSE 103* 100* 95  BUN 49* 42* 41*  CREATININE 2.68* 2.45* 2.20*  CALCIUM 9.7 8.7  --    GFR Estimated Creatinine Clearance: 59.9 mL/min (by C-G formula based on Cr of 2.2). Liver Function Tests:  Recent Labs Lab 06/15/14 2111  AST 165*  ALT 57*  ALKPHOS 33*  BILITOT 3.1*  PROT 7.3  ALBUMIN 4.0   No results for input(s): LIPASE, AMYLASE in the last 168 hours. No results for input(s): AMMONIA in the last 168 hours. Coagulation profile  Recent Labs Lab 06/15/14 2111  INR 1.08    CBC:  Recent Labs Lab 06/15/14 1839 06/15/14 2230 Jul 15, 2014 0500 07-15-2014 1701  WBC 8.1 7.7 6.5  --   NEUTROABS 4.7  --   --   --   HGB 10.9* 11.2* 9.3* 8.2*  HCT 32.5* 33.7* 28.3* 24.0*  MCV 79.7 79.7 80.2  --   PLT 9* 9* <5*  --     Studies:   Dg Chest Port 1 View  2014/07/15    IMPRESSION: 1. Right IJ line noted ending about the proximal SVC. 2. No acute cardiopulmonary process seen.   Electronically Signed   By: Garald Balding M.D.   On: 07-15-14 03:04    Assessment:  43 year old African-American male, without significant past medical history, presented with fatigue, nausea vomiting, headache and extremity numbness, and severe thrombocytopenia and mild anemia.  1. Severe thrombocytopenia and anemia, probab TTP  -he has started plasma exchange, volume 5L today. Will continue daily until plt recovers  2. Rhabdomyolysis  -Possibly related to his vigorous exercise yesterday  Recommendation  -FFP exchange daily for now -contiune solu-medro 152m iv q12h. He had reaction (N/V, not feeling well) with the IV push, I have spoken with inpt pharmacy and changed it to iv drip over 15 min  -Please send ADMTS13 inhibitor (ADMTS13 activity was sent) -please repeat daily lab including: CBC, CMP, indirct-bili, LDH, PT/INR and aPTT  -please obtain haptoglobin and reticular counts (not done), and also coomb's test  -  avoid plt transfusion unless active and life threatning bleeding    If his plt counts >20K, he probably can be transferred to step down or regular floor from hematology standpoint.   I or my colleague form hematology service will follow him closely.     Truitt Merle, MD 06/16/2014  9:05 PM

## 2014-06-16 NOTE — Progress Notes (Signed)
This encounter was created in error - please disregard.  This encounter was created in error - please disregard.

## 2014-06-16 NOTE — Procedures (Signed)
Hemodialysis Catheter Insertion Procedure Note Kelly Bright [id number] 10-28-1970  Procedure: Insertion of Central Venous Catheter Indications: Plasma Exchange  Procedure Details Consent: Risks of procedure as well as the alternatives and risks of each were explained to the (patient/caregiver).  Consent for procedure obtained. Time Out: Verified patient identification, verified procedure, site/side was marked, verified correct patient position, special equipment/implants available, medications/allergies/relevent history reviewed, required imaging and test results available.  Performed  Maximum sterile technique was used including antiseptics, cap, gloves, gown, hand hygiene, mask and sheet. Skin prep: Chlorhexidine; local anesthetic administered A antimicrobial bonded/coated triple lumen catheter was placed in the right internal jugular vein using the Seldinger technique.  Evaluation Blood flow good Complications: No apparent complications Patient did tolerate procedure well. Chest X-ray ordered to verify placement.  CXR: pending.  Procedure performed under direct ultrasound guidance for real time vessel cannulation.      Montey Hora, [facility] Pgr: [phone]  or [phone] 06/16/2014, 3:02 AM

## 2014-06-16 NOTE — Consult Note (Signed)
[facility] NOTE  Patient Care Team: Philis Fendt, MD as PCP - General (Internal Medicine)  CHIEF COMPLAINTS/PURPOSE OF CONSULTATION:  Thrombocytopenia   HISTORY OF PRESENTING ILLNESS:  Kelly Bright 43 y.o. male w/o significant past medical history, who presented to ED with fatigue and extremity numbness for one day. I was called by the ED physician to evaluate his severe thrombocytopenia.  He states he has been having mild anemia over a year, and he has been taking oral iron which was recommended by his primary care physician. He took 2 pills of over-the-counter supplements yesterday, which contains high-dose caffeine, and then went to gem for workout. He started his feeling fatigue, lightheaded, and intermittent numbness on fingers and toes, headaches, and nausea and vomiting last night. He had some chills but no fever. He noticed some bloody blister in the mouth this morning and presented to the emergency room today. His CBC showed platelet count 9k, Hemoglobin 10.9, and the peripheral smear showed positive schistocytes. He denies any episodes of confusion, or local weakness.  MEDICAL HISTORY:  No past medical history on file.  SURGICAL HISTORY: Past Surgical History  Procedure Laterality Date  . Appendectomy      SOCIAL HISTORY: History   Social History  . Marital Status: Single    Spouse Name: N/A    Number of Children: N/A  . Years of Education: N/A   Occupational History  . Not on file.   Social History Main Topics  . Smoking status: Never Smoker   . Smokeless tobacco: Not on file  . Alcohol Use: No  . Drug Use: No  . Sexual Activity: Not on file   Other Topics Concern  . Not on file   Social History Narrative    FAMILY HISTORY: No family history on file.  ALLERGIES: has No Known Allergies.  MEDICATIONS:  No current facility-administered medications for this visit.    Current Outpatient Prescriptions  Medication Sig Dispense Refill  . Ascorbic Acid (VITAMIN C) 500 MG CHEW Chew 1 tablet by mouth daily.    . cholecalciferol (VITAMIN D) 1000 UNITS tablet Take 1,000 Units by mouth daily.    Marland Kitchen glucosamine-chondroitin 500-400 MG tablet Take 1 tablet by mouth 2 (two) times daily.    . Iron TABS Take 1 tablet by mouth daily.    . Nutritional Supplements (ANTIOXIDANTS PO) Take 2 tablets by mouth daily as needed (nutritional purposes).     Facility-Administered Medications Ordered in Other Visits  Medication Dose Route Frequency Provider Last Rate Last Dose  . acetaminophen (TYLENOL) tablet 650 mg 650 mg Oral Q6H PRN Theressa Millard, MD  650 mg at 06/15/14 2334   Or  . acetaminophen (TYLENOL) suppository 650 mg 650 mg Rectal Q6H PRN Theressa Millard, MD    . pantoprazole (PROTONIX) injection 40 mg 40 mg Intravenous Q24H Theressa Millard, MD  40 mg at 06/15/14 2258  . sucralfate (CARAFATE) 1 GM/10ML suspension 1 g 1 g Oral 4 times per day Theressa Millard, MD  1 g at 06/15/14 2256    REVIEW OF SYSTEMS:  Constitutional: Denies fevers, chills or abnormal night sweats, positive for fatigue Eyes: Denies blurriness of vision, double vision or watery eyes Ears, nose, mouth, throat, and face: Denies mucositis or sore throat Respiratory: Denies cough, dyspnea or wheezes Cardiovascular: Denies palpitation, chest discomfort or lower extremity swelling Gastrointestinal: Positive for intermittent nausea and vomiting, no heartburn or change in bowel habits Skin: Denies abnormal skin  rashes Lymphatics: Denies new lymphadenopathy or easy bruising Neurological: Intermittent numbness of feet and hands, no tingling or new weaknesses Behavioral/Psych: Mood is stable, no new changes  All other systems were reviewed with the patient and are negative.  PHYSICAL EXAMINATION: His  vital sign are normal in the ED GENERAL:alert, no distress and comfortable SKIN: skin color, texture, turgor are normal, no rashes or significant lesions EYES: normal, conjunctiva are pink and non-injected, sclera clear OROPHARYNX:no exudate, no erythema and lips, buccal mucosa, and tongue normal, I feel bloody blister on the bilateral mucousa NECK: supple, thyroid normal size, non-tender, without nodularity LYMPH: no palpable lymphadenopathy in the cervical, axillary or inguinal LUNGS: clear to auscultation and percussion with normal breathing effort HEART: regular rate & rhythm and no murmurs and no lower extremity edema ABDOMEN:abdomen soft, non-tender and normal bowel sounds Musculoskeletal:no cyanosis of digits and no clubbing  PSYCH: alert & oriented x 3 with fluent speech NEURO: no focal motor/sensory deficits  LABORATORY DATA:  I have reviewed the data as listed Recent Results (from the past 2160 hour(s))  CBC with Differential Status: Abnormal   Collection Time: 06/15/14 6:39 PM  Result Value Ref Range   WBC 8.1 4.0 - 10.5 K/uL   RBC 4.08 (L) 4.22 - 5.81 MIL/uL   Hemoglobin 10.9 (L) 13.0 - 17.0 g/dL   HCT 32.5 (L) 39.0 - 52.0 %   MCV 79.7 78.0 - 100.0 fL   MCH 26.7 26.0 - 34.0 pg   MCHC 33.5 30.0 - 36.0 g/dL   RDW 15.2 11.5 - 15.5 %   Platelets 9 (LL) 150 - 400 K/uL    Comment: RESULT REPEATED AND VERIFIED SPECIMEN CHECKED FOR CLOTS PLATELET COUNT CONFIRMED BY SMEAR CRITICAL RESULT CALLED TO, READ BACK BY AND VERIFIED WITH: NEILSON,T AT 1945 ON [id number] BY HOOKER,B    Neutrophils Relative % 59 43 - 77 %   Lymphocytes Relative 28 12 - 46 %   Monocytes Relative 13 (H) 3 - 12 %   Eosinophils Relative 0 0 - 5 %   Basophils Relative 0 0 - 1 %   Neutro Abs 4.7 1.7 - 7.7 K/uL   Lymphs Abs 2.3 0.7 - 4.0 K/uL   Monocytes Absolute 1.1 (H) 0.1 - 1.0 K/uL   Eosinophils Absolute 0.0 0.0 -  0.7 K/uL   Basophils Absolute 0.0 0.0 - 0.1 K/uL   RBC Morphology SCHISTOCYTES PRESENT (2-5/hpf)     Comment: POLYCHROMASIA PRESENT   WBC Morphology MILD LEFT SHIFT (1-5% METAS, OCC MYELO, OCC BANDS)   Basic metabolic panel Status: Abnormal   Collection Time: 06/15/14 6:39 PM  Result Value Ref Range   Sodium 140 137 - 147 mEq/L   Potassium 3.8 3.7 - 5.3 mEq/L   Chloride 100 96 - 112 mEq/L   CO2 26 19 - 32 mEq/L   Glucose, Bld 103 (H) 70 - 99 mg/dL   BUN 49 (H) 6 - 23 mg/dL   Creatinine, Ser 2.68 (H) 0.50 - 1.35 mg/dL   Calcium 9.7 8.4 - 10.5 mg/dL   GFR calc non Af Amer 28 (L) >90 mL/min   GFR calc Af Amer 32 (L) >90 mL/min    Comment: (NOTE) The eGFR has been calculated using the CKD EPI equation. This calculation has not been validated in all clinical situations. eGFR's persistently <90 mL/min signify possible Chronic Kidney Disease.    Anion gap 14 5 - 15  CK Status: Abnormal   Collection Time: 06/15/14 6:39 PM  Result Value  Ref Range   Total CK 5214 (H) 7 - 232 U/L  Urinalysis, Routine w reflex microscopic Status: Abnormal   Collection Time: 06/15/14 7:39 PM  Result Value Ref Range   Color, Urine AMBER (A) YELLOW    Comment: BIOCHEMICALS MAY BE AFFECTED BY COLOR   APPearance CLEAR CLEAR   Specific Gravity, Urine 1.014 1.005 - 1.030   pH 5.5 5.0 - 8.0   Glucose, UA NEGATIVE NEGATIVE mg/dL   Hgb urine dipstick LARGE (A) NEGATIVE   Bilirubin Urine NEGATIVE NEGATIVE   Ketones, ur NEGATIVE NEGATIVE mg/dL   Protein, ur 100 (A) NEGATIVE mg/dL   Urobilinogen, UA 0.2 0.0 - 1.0 mg/dL   Nitrite NEGATIVE NEGATIVE   Leukocytes, UA NEGATIVE NEGATIVE  Urine microscopic-add on Status: None   Collection Time: 06/15/14 7:39 PM  Result Value Ref Range   Squamous Epithelial / LPF RARE RARE   WBC, UA 0-2 <3 WBC/hpf    RBC / HPF 0-2 <3 RBC/hpf   Urine-Other AMORPHOUS URATES/PHOSPHATES   Hepatic function panel Status: Abnormal   Collection Time: 06/15/14 9:11 PM  Result Value Ref Range   Total Protein 7.3 6.0 - 8.3 g/dL   Albumin 4.0 3.5 - 5.2 g/dL   AST 165 (H) 0 - 37 U/L   ALT 57 (H) 0 - 53 U/L   Alkaline Phosphatase 33 (L) 39 - 117 U/L   Total Bilirubin 3.1 (H) 0.3 - 1.2 mg/dL   Bilirubin, Direct 0.4 (H) 0.0 - 0.3 mg/dL   Indirect Bilirubin 2.7 (H) 0.3 - 0.9 mg/dL  Sedimentation rate Status: Abnormal   Collection Time: 06/15/14 9:11 PM  Result Value Ref Range   Sed Rate 28 (H) 0 - 16 mm/hr  Lactate dehydrogenase Status: Abnormal   Collection Time: 06/15/14 9:11 PM  Result Value Ref Range   LDH 1528 (H) 94 - 250 U/L  D-dimer, quantitative Status: Abnormal   Collection Time: 06/15/14 9:11 PM  Result Value Ref Range   D-Dimer, Quant 2.89 (H) 0.00 - 0.48 ug/mL-FEU    Comment:   AT THE INHOUSE ESTABLISHED CUTOFF VALUE OF 0.48 ug/mL FEU, THIS ASSAY HAS BEEN DOCUMENTED IN THE LITERATURE TO HAVE A SENSITIVITY AND NEGATIVE PREDICTIVE VALUE OF AT LEAST 98 TO 99%. THE TEST RESULT SHOULD BE CORRELATED WITH AN ASSESSMENT OF THE CLINICAL PROBABILITY OF DVT / VTE.   Fibrinogen Status: None   Collection Time: 06/15/14 9:11 PM  Result Value Ref Range   Fibrinogen 472 204 - 475 mg/dL  Protime-INR Status: None   Collection Time: 06/15/14 9:11 PM  Result Value Ref Range   Prothrombin Time 14.1 11.6 - 15.2 seconds   INR 1.08 0.00 - 1.49  APTT Status: None   Collection Time: 06/15/14 9:11 PM  Result Value Ref Range   aPTT 29 24 - 37 seconds  CBC Status: Abnormal   Collection Time: 06/15/14 10:30 PM  Result Value Ref Range   WBC 7.7 4.0 - 10.5 K/uL   RBC 4.23 4.22 - 5.81 MIL/uL   Hemoglobin 11.2 (L) 13.0 - 17.0 g/dL    HCT 33.7 (L) 39.0 - 52.0 %   MCV 79.7 78.0 - 100.0 fL   MCH 26.5 26.0 - 34.0 pg   MCHC 33.2 30.0 - 36.0 g/dL   RDW 15.4 11.5 - 15.5 %   Platelets 9 (LL) 150 - 400 K/uL    Comment: REPEATED TO VERIFY CRITICAL VALUE NOTED. VALUE IS CONSISTENT WITH PREVIOUSLY REPORTED AND CALLED VALUE.     RADIOGRAPHIC STUDIES:  ASSESSMENT & PLAN:  43 year old African-American male, without significant past medical history, presented with fatigue, nausea vomiting, headache and extremity numbness, and severe thrombocytopenia and mild anemia.  1. 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 Giving the severe some cytopenia, anemia with lab evidence of hemolysis ( including high LDH and elevated indirect bilirubin, his haptoglobin is still pending), acute renal failure, neuro symptoms (headache and extremity numbness), this is highly suspicious for TTP. -Please send for ADAMTS 13 level and inhibitor tomorrow -The other differential including DIC, and other etiology of MAHA. He has normal PT, PTT, fibrinogen levels, and d-dimer is not remarkably elevated, which makes DIC is much less likely.  2. Rhabdomyolysis  -Possibly related to his vigorous exercise yesterday  Recommendation  #1 I have spent spoken with [facility] ED physician Dr. Colin Rhein regarding transfer him to [facility] for catheter placement tonight, and initiating plasma exchange tomorrow morning. Dr. Colin Rhein has contacted renal service, and is a wall started plasmic exchange tomorrow.  #2 start methylprednisolone 125 mg IV every 12 hours. I'll adjust his steroid dose based on his response to plasma exchange. #3 he has no active bleeding, please do not give platelet transfusion, which is relatively contraindicated in TTP management. Consider platelet transfusion only for life-threatening bleeding.  #4 please send ADAMTS 13  activity and inhibitor test tomorrow. #5 consult renal service for plasma exchange.  #6 adequate IV hydration for Rhabdomyolysis  I have discussed the above plan with [facility] ICU on-call physician who agrees.   Please call me at my cell [phone] if you have any questions. I'll follow-up with him during his hospital stay.  All questions were answered. The patient knows to call the clinic with any problems, questions or concerns. I spent 40 minutes counseling the patient face to face. The total time spent in the appointment was 60 minutes and more than 50% was on counseling.   Truitt Merle, MD 06/15/2014 11:59 PM

## 2014-06-16 NOTE — Progress Notes (Signed)
UR Completed.  Kelly Bright [phone] 06/16/2014

## 2014-06-17 DIAGNOSIS — M6282 Rhabdomyolysis: Secondary | ICD-10-CM

## 2014-06-17 DIAGNOSIS — M311 Thrombotic microangiopathy: Principal | ICD-10-CM

## 2014-06-17 DIAGNOSIS — N179 Acute kidney failure, unspecified: Secondary | ICD-10-CM

## 2014-06-17 DIAGNOSIS — D696 Thrombocytopenia, unspecified: Secondary | ICD-10-CM

## 2014-06-17 DIAGNOSIS — D509 Iron deficiency anemia, unspecified: Secondary | ICD-10-CM | POA: Insufficient documentation

## 2014-06-17 LAB — CBC WITH DIFFERENTIAL/PLATELET
BASOS PCT: 0 % (ref 0–1)
Basophils Absolute: 0 10*3/uL (ref 0.0–0.1)
EOS PCT: 0 % (ref 0–5)
Eosinophils Absolute: 0 10*3/uL (ref 0.0–0.7)
HEMATOCRIT: 26.3 % — AB (ref 39.0–52.0)
HEMOGLOBIN: 8.6 g/dL — AB (ref 13.0–17.0)
Lymphocytes Relative: 15 % (ref 12–46)
Lymphs Abs: 2.3 10*3/uL (ref 0.7–4.0)
MCH: 25.9 pg — ABNORMAL LOW (ref 26.0–34.0)
MCHC: 32.7 g/dL (ref 30.0–36.0)
MCV: 79.2 fL (ref 78.0–100.0)
MONO ABS: 0.9 10*3/uL (ref 0.1–1.0)
MONOS PCT: 6 % (ref 3–12)
Neutro Abs: 11.8 10*3/uL — ABNORMAL HIGH (ref 1.7–7.7)
Neutrophils Relative %: 79 % — ABNORMAL HIGH (ref 43–77)
Platelets: 24 10*3/uL — CL (ref 150–400)
RBC: 3.32 MIL/uL — ABNORMAL LOW (ref 4.22–5.81)
RDW: 16.2 % — AB (ref 11.5–15.5)
WBC: 15 10*3/uL — ABNORMAL HIGH (ref 4.0–10.5)

## 2014-06-17 LAB — THERAPEUTIC PLASMA EXCHANGE (BLOOD BANK)
Plasma volume needed: 5000
UNIT DIVISION: 0
UNIT DIVISION: 0
UNIT DIVISION: 0
UNIT DIVISION: 0
UNIT DIVISION: 0
Unit division: 0
Unit division: 0
Unit division: 0
Unit division: 0
Unit division: 0
Unit division: 0
Unit division: 0
Unit division: 0
Unit division: 0
Unit division: 0
Unit division: 0
Unit division: 0
Unit division: 0
Unit division: 0

## 2014-06-17 LAB — RENAL FUNCTION PANEL
ANION GAP: 11 (ref 5–15)
Albumin: 3.1 g/dL — ABNORMAL LOW (ref 3.5–5.2)
BUN: 39 mg/dL — ABNORMAL HIGH (ref 6–23)
CO2: 27 meq/L (ref 19–32)
Calcium: 9.2 mg/dL (ref 8.4–10.5)
Chloride: 104 mEq/L (ref 96–112)
Creatinine, Ser: 2.2 mg/dL — ABNORMAL HIGH (ref 0.50–1.35)
GFR calc non Af Amer: 35 mL/min — ABNORMAL LOW (ref >90)
GFR, EST AFRICAN AMERICAN: 41 mL/min — AB (ref >90)
GLUCOSE: 119 mg/dL — AB (ref 70–99)
POTASSIUM: 4.3 meq/L (ref 3.7–5.3)
Phosphorus: 3.2 mg/dL (ref 2.3–4.6)
Sodium: 142 mEq/L (ref 137–147)

## 2014-06-17 LAB — CBC
HCT: 24.7 % — ABNORMAL LOW (ref 39.0–52.0)
HEMOGLOBIN: 8.3 g/dL — AB (ref 13.0–17.0)
MCH: 26.3 pg (ref 26.0–34.0)
MCHC: 33.6 g/dL (ref 30.0–36.0)
MCV: 78.4 fL (ref 78.0–100.0)
Platelets: 14 10*3/uL — CL (ref 150–400)
RBC: 3.15 MIL/uL — AB (ref 4.22–5.81)
RDW: 16.2 % — ABNORMAL HIGH (ref 11.5–15.5)
WBC: 14.6 10*3/uL — AB (ref 4.0–10.5)

## 2014-06-17 LAB — APTT: APTT: 25 s (ref 24–37)

## 2014-06-17 LAB — RETICULOCYTES
RBC.: 3.32 MIL/uL — ABNORMAL LOW (ref 4.22–5.81)
Retic Count, Absolute: 112.9 10*3/uL (ref 19.0–186.0)
Retic Ct Pct: 3.4 % — ABNORMAL HIGH (ref 0.4–3.1)

## 2014-06-17 LAB — COMPREHENSIVE METABOLIC PANEL
ALT: 30 U/L (ref 0–53)
AST: 38 U/L — AB (ref 0–37)
Albumin: 3.3 g/dL — ABNORMAL LOW (ref 3.5–5.2)
Alkaline Phosphatase: 41 U/L (ref 39–117)
Anion gap: 11 (ref 5–15)
BUN: 37 mg/dL — ABNORMAL HIGH (ref 6–23)
CO2: 28 meq/L (ref 19–32)
CREATININE: 2.11 mg/dL — AB (ref 0.50–1.35)
Calcium: 9.3 mg/dL (ref 8.4–10.5)
Chloride: 105 mEq/L (ref 96–112)
GFR calc Af Amer: 43 mL/min — ABNORMAL LOW (ref >90)
GFR, EST NON AFRICAN AMERICAN: 37 mL/min — AB (ref >90)
GLUCOSE: 117 mg/dL — AB (ref 70–99)
Potassium: 4.3 mEq/L (ref 3.7–5.3)
SODIUM: 144 meq/L (ref 137–147)
Total Bilirubin: 0.8 mg/dL (ref 0.3–1.2)
Total Protein: 6 g/dL (ref 6.0–8.3)

## 2014-06-17 LAB — HEPATIC FUNCTION PANEL
ALBUMIN: 3.1 g/dL — AB (ref 3.5–5.2)
ALK PHOS: 38 U/L — AB (ref 39–117)
ALT: 28 U/L (ref 0–53)
AST: 42 U/L — AB (ref 0–37)
Bilirubin, Direct: 0.2 mg/dL (ref 0.0–0.3)
Indirect Bilirubin: 0.8 mg/dL (ref 0.3–0.9)
TOTAL PROTEIN: 5.5 g/dL — AB (ref 6.0–8.3)
Total Bilirubin: 1 mg/dL (ref 0.3–1.2)

## 2014-06-17 LAB — PROTIME-INR
INR: 1.13 (ref 0.00–1.49)
Prothrombin Time: 14.6 seconds (ref 11.6–15.2)

## 2014-06-17 LAB — LACTATE DEHYDROGENASE
LDH: 587 U/L — ABNORMAL HIGH (ref 94–250)
LDH: 570 U/L — ABNORMAL HIGH (ref 94–250)

## 2014-06-17 LAB — POCT I-STAT, CHEM 8
BUN: 35 mg/dL — AB (ref 6–23)
Calcium, Ion: 1.29 mmol/L — ABNORMAL HIGH (ref 1.12–1.23)
Chloride: 104 mEq/L (ref 96–112)
Creatinine, Ser: 2.2 mg/dL — ABNORMAL HIGH (ref 0.50–1.35)
Glucose, Bld: 117 mg/dL — ABNORMAL HIGH (ref 70–99)
HCT: 26 % — ABNORMAL LOW (ref 39.0–52.0)
HEMOGLOBIN: 8.8 g/dL — AB (ref 13.0–17.0)
POTASSIUM: 4.1 meq/L (ref 3.7–5.3)
SODIUM: 143 meq/L (ref 137–147)
TCO2: 27 mmol/L (ref 0–100)

## 2014-06-17 LAB — HIV ANTIBODY (ROUTINE TESTING W REFLEX): HIV: NONREACTIVE

## 2014-06-17 LAB — CK: CK TOTAL: 915 U/L — AB (ref 7–232)

## 2014-06-17 MED ORDER — ANTICOAGULANT SODIUM CITRATE 4% (200MG/5ML) IV SOLN
5.0000 mL | Freq: Once | Status: DC
  Filled 2014-06-17: qty 250

## 2014-06-17 MED ORDER — CALCIUM CARBONATE ANTACID 500 MG PO CHEW
CHEWABLE_TABLET | ORAL | Status: AC
  Filled 2014-06-17: qty 2

## 2014-06-17 MED ORDER — CALCIUM CARBONATE ANTACID 500 MG PO CHEW
CHEWABLE_TABLET | ORAL | Status: AC
  Administered 2014-06-17: 400 mg via ORAL
  Filled 2014-06-17: qty 4

## 2014-06-17 MED ORDER — CALCIUM CARBONATE ANTACID 500 MG PO CHEW
2.0000 | CHEWABLE_TABLET | ORAL | Status: AC
  Administered 2014-06-17 (×2): 400 mg via ORAL
  Filled 2014-06-17 (×2): qty 2

## 2014-06-17 MED ORDER — DIPHENHYDRAMINE HCL 25 MG PO CAPS: 25.0000 mg | ORAL_CAPSULE | Freq: Four times a day (QID) | ORAL | Status: DC | PRN

## 2014-06-17 MED ORDER — DIPHENHYDRAMINE HCL 25 MG PO CAPS
ORAL_CAPSULE | ORAL | Status: AC
  Administered 2014-06-17: 25 mg via ORAL
  Filled 2014-06-17: qty 1

## 2014-06-17 MED ORDER — SODIUM CHLORIDE 0.9 % IV SOLN
4.0000 g | Freq: Once | INTRAVENOUS | Status: AC
  Administered 2014-06-17: 4 g via INTRAVENOUS
  Filled 2014-06-17: qty 40

## 2014-06-17 MED ORDER — ACD FORMULA A 0.73-2.45-2.2 GM/100ML VI SOLN
Status: AC
  Filled 2014-06-17: qty 500

## 2014-06-17 MED ORDER — ACD FORMULA A 0.73-2.45-2.2 GM/100ML VI SOLN
500.0000 mL | Status: DC
  Administered 2014-06-17: 500 mL via INTRAVENOUS
  Administered 2014-06-19: 13:00:00 via INTRAVENOUS
  Filled 2014-06-17: qty 500

## 2014-06-17 MED ORDER — ACETAMINOPHEN 325 MG PO TABS: 650.0000 mg | ORAL_TABLET | ORAL | Status: DC | PRN

## 2014-06-17 NOTE — Plan of Care (Signed)
Problem: Phase I Progression Outcomes Goal: Initial discharge plan identified Outcome: Completed/Met Date Met:  06/17/14

## 2014-06-17 NOTE — Plan of Care (Signed)
Problem: Phase I Progression Outcomes Goal: Other Phase I Outcomes/Goals Outcome: Not Applicable Date Met:  06/17/14     

## 2014-06-17 NOTE — Plan of Care (Signed)
Problem: Consults Goal: Diabetes Guidelines if Diabetic/Glucose > 140 If diabetic or lab glucose is > 140 mg/dl - Initiate Diabetes/Hyperglycemia Guidelines & Document Interventions  Outcome: Not Applicable Date Met:  06/17/14     

## 2014-06-17 NOTE — Plan of Care (Signed)
Problem: Phase I Progression Outcomes Goal: Pain controlled with appropriate interventions Outcome: Not Applicable Date Met:  50/38/88 Patient denies pain

## 2014-06-17 NOTE — Plan of Care (Signed)
Problem: Consults Goal: Nutrition Consult-if indicated Outcome: Not Applicable Date Met:  95/09/32 Regular Diet

## 2014-06-17 NOTE — Plan of Care (Signed)
Problem: Phase II Progression Outcomes Goal: Progress activity as tolerated unless otherwise ordered Outcome: Completed/Met Date Met:  06/17/14  Problem: Phase III Progression Outcomes Goal: Voiding independently Outcome: Completed/Met Date Met:  06/17/14 Goal: Foley discontinued Outcome: Not Applicable Date Met:  14/97/02

## 2014-06-17 NOTE — Plan of Care (Signed)
Problem: Phase II Progression Outcomes Goal: Progress activity as tolerated unless otherwise ordered Outcome: Progressing

## 2014-06-17 NOTE — Progress Notes (Signed)
PULMONARY / CRITICAL CARE MEDICINE   Name: Kelly Bright MRN: [id number] DOB: 1970-08-02    ADMISSION DATE:  06/15/2014 CONSULTATION DATE:  06/17/2014  REFERRING MD:  Arnoldo Morale  CHIEF COMPLAINT:  Muscle Aches  INITIAL PRESENTATION:  43 y.o. M who presented to [facility] ED 11/29 with significant muscle aches / pains.  He took 2 tablets of OTC weight loss supplement one day prior and proceeded to have a very strenuous workout.  In ED, he was found to have rhabdo and thrombocytopenia (PLT count of 9).  He has also developed blisters in his mouth as well as bruising on arms.  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  He was transferred to [facility] ICU and PCCM was consulted for placement of HD catheter for immediate PLEX.  STUDIES:  None  SIGNIFICANT EVENTS: 11/29 - presented to [facility] with diffuse muscle aches / dark urine, found to have rhabdo + TTP.  Transferred to [facility] 11/30 - PLEX + steroids started  SUBJECTIVE:  Voices no complaints at this time.   VITAL SIGNS: Temp:  [96.8 F (36 C)-99 F (37.2 C)] 98.3 F (36.8 C) (12/01 1401) Pulse Rate:  [69-106] 99 (12/01 1500) Resp:  [12-26] 19 (12/01 1500) BP: (99-154)/(54-106) 139/79 mmHg (12/01 1500) SpO2:  [98 %-100 %] 100 % (12/01 1500) Weight:  [94.802 kg (209 lb)-109.6 kg (241 lb 10 oz)] 94.802 kg (209 lb) (12/01 1105) HEMODYNAMICS:   VENTILATOR SETTINGS:   INTAKE / OUTPUT: Intake/Output      11/30 0701 - 12/01 0700 12/01 0701 - 12/02 0700   P.O. 480    I.V. (mL/kg) 2275 (20.8) 375 (4)   IV Piggyback 290    Total Intake(mL/kg) 3045 (27.8) 375 (4)   Urine (mL/kg/hr) 1700 (0.6) 1025 (1.3)   Emesis/NG output     Total Output 1700 1025   Net +1345 -650          PHYSICAL EXAMINATION: General: NAD. Neuro: A&O x 3, no focal deficits.  HEENT: [place]/AT. Cardiovascular: RRR, normal s1s2, no M/R/G.  Lungs: CTAB, no w/r/c Abdomen: soft, NT/ND, normal bowel sounds.  Musculoskeletal: No gross deformities, no edema.  Skin: Intact, warm, no  rashes.  LABS:  CBC  Recent Labs Lab 06/16/14 2210 06/17/14 0500 06/17/14 0930 06/17/14 1153  WBC 12.3* 14.6* 15.0*  --   HGB 9.6* 8.3* 8.6* 8.8*  HCT 29.3* 24.7* 26.3* 26.0*  PLT 6* 14* 24*  --    Coag's  Recent Labs Lab 06/15/14 2111 06/17/14 1100  APTT 29 25  INR 1.08 1.13   BMET  Recent Labs Lab 06/16/14 2210 06/17/14 0500 06/17/14 0930 06/17/14 1153  NA 142 142 144 143  K 3.9 4.3 4.3 4.1  CL 102 104 105 104  CO2 26 27 28   --   BUN 39* 39* 37* 35*  CREATININE 2.28* 2.20* 2.11* 2.20*  GLUCOSE 163* 119* 117* 117*   Electrolytes  Recent Labs Lab 06/16/14 2210 06/17/14 0500 06/17/14 0930  CALCIUM 10.1 9.2 9.3  PHOS 1.9* 3.2  --    Sepsis Markers No results for input(s): LATICACIDVEN, PROCALCITON, O2SATVEN in the last 168 hours. ABG No results for input(s): PHART, PCO2ART, PO2ART in the last 168 hours. Liver Enzymes  Recent Labs Lab 06/15/14 2111 06/16/14 2210 06/17/14 0500 06/17/14 0930  AST 165*  --  42* 38*  ALT 57*  --  28 30  ALKPHOS 33*  --  38* 41  BILITOT 3.1*  --  1.0 0.8  ALBUMIN  4.0 3.4* 3.1*  3.1* 3.3*   Cardiac Enzymes No results for input(s): TROPONINI, PROBNP in the last 168 hours. Glucose  Recent Labs Lab 06/16/14 0200 06/16/14 0823  GLUCAP 112* 108*    Imaging Dg Chest Port 1 View  06/16/2014   CLINICAL DATA:  Status post hemodialysis catheter insertion.  EXAM: PORTABLE CHEST - 1 VIEW  COMPARISON:  None.  FINDINGS: The patient's right IJ line is seen ending about the proximal SVC.  The lungs are well-aerated and clear. There is no evidence of focal opacification, pleural effusion or pneumothorax.  The cardiomediastinal silhouette is within normal limits. No acute osseous abnormalities are seen.  IMPRESSION: 1. Right IJ line noted ending about the proximal SVC. 2. No acute cardiopulmonary process seen.   Electronically Signed   By: Garald Balding M.D.   On: 06/16/2014 03:04   ASSESSMENT / PLAN:  HEMATOLOGIC A:    TTP - significant thrombocytopenia, anemia, elevated LDH, elevated T bili and indirect bili, elevated D-dimer, + schistocytes on blood smear. Anemia VTE Prophylaxis P:  PLEX treatment #2 12/1, Heme/Onc following. Solumedrol 134m q12hrs per heme-onc recs. ADAMTS 13 level pending Transfuse per usual ICU guidelines. SCD's only. CBC trend  RENAL A:   AKI, Scr stable Rhabdomyolysis - initial CK 5214 P:   Continue IVF hydration NS @ 125. BMP trend.  PULMONARY A: No acute issues P:   Monitor clinically.  CARDIOVASCULAR R IJ HD Cath 11/30 >>> A:  No acute issues P:  Monitor clinically.  GASTROINTESTINAL A:   Nausea with vomiting Nutrition P:   Zofran PRN. Advance diet as tolerated.  INFECTIOUS A:   No evidence of infection P:   Monitor clinically.  ENDOCRINE A:   No known issues P:   SSI if glucose consistently > 180  NEUROLOGIC A:   Pain P:   Oxycodone PRN  Family updated: None available.  Interdisciplinary Family Meeting v Palliative Care Meeting:  Due by: 12/6.  OK to transfer to floor  PGeorgann Housekeeper ACNP [place] [facility] Pager [phone] [phone] Platelet improved, will transfer to the floor, PT[facility]OT[facility] will [facility], please call back if needed.  Patient seen and examined, agree with above note.  I dictated the care and orders written for this patient under my direction.  WRush Farmer MD [phone]

## 2014-06-17 NOTE — Plan of Care (Signed)
Problem: Phase I Progression Outcomes Goal: OOB as tolerated unless otherwise ordered Outcome: Completed/Met Date Met:  06/17/14     

## 2014-06-17 NOTE — Plan of Care (Signed)
Problem: Consults Goal: Nutrition Consult-if indicated Outcome: Not Applicable Date Met:  14/60/47

## 2014-06-17 NOTE — Plan of Care (Signed)
Problem: Consults Goal: Skin Care Protocol Initiated - if Braden Score 18 or less If consults are not indicated, leave blank or document N/A  Outcome: Not Applicable Date Met:  03/70/48

## 2014-06-17 NOTE — Plan of Care (Signed)
Problem: Consults Goal: General Medical Patient Education See Patient Education Module for specific education. Outcome: Completed/Met Date Met:  06/17/14     

## 2014-06-17 NOTE — Plan of Care (Signed)
Problem: Phase I Progression Outcomes Goal: Hemodynamically stable Outcome: Completed/Met Date Met:  06/17/14

## 2014-06-17 NOTE — Plan of Care (Signed)
Problem: Phase I Progression Outcomes Goal: Voiding-avoid urinary catheter unless indicated Outcome: Completed/Met Date Met:  06/17/14     

## 2014-06-17 NOTE — Plan of Care (Signed)
Problem: Phase II Progression Outcomes Goal: Obtain order to discontinue catheter if appropriate Outcome: Not Applicable Date Met:  61/44/31

## 2014-06-18 DIAGNOSIS — R609 Edema, unspecified: Secondary | ICD-10-CM

## 2014-06-18 DIAGNOSIS — R29898 Other symptoms and signs involving the musculoskeletal system: Secondary | ICD-10-CM

## 2014-06-18 LAB — THERAPEUTIC PLASMA EXCHANGE (BLOOD BANK)
Plasma Exchange: 5000
Plasma volume needed: 5000
UNIT DIVISION: 0
UNIT DIVISION: 0
UNIT DIVISION: 0
UNIT DIVISION: 0
UNIT DIVISION: 0
UNIT DIVISION: 0
UNIT DIVISION: 0
Unit division: 0
Unit division: 0
Unit division: 0
Unit division: 0
Unit division: 0
Unit division: 0
Unit division: 0
Unit division: 0
Unit division: 0
Unit division: 0

## 2014-06-18 LAB — BASIC METABOLIC PANEL
Anion gap: 15 (ref 5–15)
BUN: 34 mg/dL — ABNORMAL HIGH (ref 6–23)
CALCIUM: 9.2 mg/dL (ref 8.4–10.5)
CHLORIDE: 105 meq/L (ref 96–112)
CO2: 25 meq/L (ref 19–32)
Creatinine, Ser: 1.92 mg/dL — ABNORMAL HIGH (ref 0.50–1.35)
GFR calc Af Amer: 48 mL/min — ABNORMAL LOW (ref >90)
GFR calc non Af Amer: 42 mL/min — ABNORMAL LOW (ref >90)
Glucose, Bld: 100 mg/dL — ABNORMAL HIGH (ref 70–99)
Potassium: 4.3 mEq/L (ref 3.7–5.3)
SODIUM: 145 meq/L (ref 137–147)

## 2014-06-18 LAB — POCT I-STAT, CHEM 8
BUN: 32 mg/dL — ABNORMAL HIGH (ref 6–23)
CREATININE: 1.9 mg/dL — AB (ref 0.50–1.35)
Calcium, Ion: 1.16 mmol/L (ref 1.12–1.23)
Chloride: 102 mEq/L (ref 96–112)
Glucose, Bld: 133 mg/dL — ABNORMAL HIGH (ref 70–99)
HEMATOCRIT: 26 % — AB (ref 39.0–52.0)
HEMOGLOBIN: 8.8 g/dL — AB (ref 13.0–17.0)
POTASSIUM: 3.8 meq/L (ref 3.7–5.3)
SODIUM: 143 meq/L (ref 137–147)
TCO2: 26 mmol/L (ref 0–100)

## 2014-06-18 LAB — MAGNESIUM: MAGNESIUM: 1.7 mg/dL (ref 1.5–2.5)

## 2014-06-18 LAB — CBC
HCT: 24.9 % — ABNORMAL LOW (ref 39.0–52.0)
HEMOGLOBIN: 8.1 g/dL — AB (ref 13.0–17.0)
MCH: 26 pg (ref 26.0–34.0)
MCHC: 32.5 g/dL (ref 30.0–36.0)
MCV: 79.8 fL (ref 78.0–100.0)
Platelets: 47 10*3/uL — ABNORMAL LOW (ref 150–400)
RBC: 3.12 MIL/uL — AB (ref 4.22–5.81)
RDW: 16.1 % — ABNORMAL HIGH (ref 11.5–15.5)
WBC: 18.2 10*3/uL — ABNORMAL HIGH (ref 4.0–10.5)

## 2014-06-18 LAB — PHOSPHORUS: Phosphorus: 4.1 mg/dL (ref 2.3–4.6)

## 2014-06-18 MED ORDER — ACD FORMULA A 0.73-2.45-2.2 GM/100ML VI SOLN
Status: AC
  Filled 2014-06-18: qty 500

## 2014-06-18 MED ORDER — ANTICOAGULANT SODIUM CITRATE 4% (200MG/5ML) IV SOLN
5.0000 mL | Freq: Once | Status: AC
  Administered 2014-06-18: 5 mL
  Filled 2014-06-18: qty 250

## 2014-06-18 MED ORDER — DIPHENHYDRAMINE HCL 25 MG PO CAPS
25.0000 mg | ORAL_CAPSULE | Freq: Four times a day (QID) | ORAL | Status: DC | PRN
  Administered 2014-06-19: 25 mg via ORAL

## 2014-06-18 MED ORDER — SODIUM CHLORIDE 0.9 % IV SOLN
4.0000 g | Freq: Once | INTRAVENOUS | Status: AC
  Administered 2014-06-18: 4 g via INTRAVENOUS
  Filled 2014-06-18: qty 40

## 2014-06-18 MED ORDER — SODIUM CHLORIDE 0.9 % IV SOLN
Freq: Every day | INTRAVENOUS | Status: DC
  Filled 2014-06-18 (×3): qty 25

## 2014-06-18 MED ORDER — SODIUM CHLORIDE 0.9 % IJ SOLN
10.0000 mL | INTRAMUSCULAR | Status: DC | PRN
  Administered 2014-06-18 – 2014-06-23 (×8): 10 mL
  Filled 2014-06-18 (×7): qty 40

## 2014-06-18 MED ORDER — ACETAMINOPHEN 325 MG PO TABS: 650.0000 mg | ORAL_TABLET | ORAL | Status: DC | PRN

## 2014-06-18 MED ORDER — CALCIUM CARBONATE ANTACID 500 MG PO CHEW
CHEWABLE_TABLET | ORAL | Status: AC
  Filled 2014-06-18: qty 2

## 2014-06-18 MED ORDER — ACD FORMULA A 0.73-2.45-2.2 GM/100ML VI SOLN
500.0000 mL | Status: DC
  Administered 2014-06-18: 500 mL via INTRAVENOUS
  Filled 2014-06-18: qty 500

## 2014-06-18 MED ORDER — CALCIUM CARBONATE ANTACID 500 MG PO CHEW
2.0000 | CHEWABLE_TABLET | ORAL | Status: AC
Start: 2014-06-18 — End: 2014-06-18
  Administered 2014-06-18: 400 mg via ORAL
  Filled 2014-06-18 (×2): qty 2

## 2014-06-18 NOTE — Plan of Care (Signed)
Problem: Phase II Progression Outcomes Goal: Discharge plan established Outcome: Completed/Met Date Met:  06/18/14 Goal: Vital signs remain stable Outcome: Completed/Met Date Met:  06/18/14 Goal: Other Phase II Outcomes/Goals Outcome: Not Applicable Date Met:  03/70/96  Problem: Phase III Progression Outcomes Goal: Pain controlled on oral analgesia Outcome: Completed/Met Date Met:  06/18/14 Goal: Other Phase III Outcomes/Goals Outcome: Not Applicable Date Met:  43/83/81

## 2014-06-18 NOTE — Progress Notes (Signed)
Patient reported that he has noticed swelling in his left lower arm. RN assessed IV site (not infiltrated) and patient stated that the IV did not hurt. Patient states that since he has been here he has noticed that the arm continues to swell each day. Patient given heat pad- will continue to monitor

## 2014-06-18 NOTE — Progress Notes (Signed)
PROGRESS NOTE  Kelly Bright DXA:[id number] DOB: 12-May-1971 DOA: 06/15/2014 PCP: Philis Fendt, MD  HPI: 43 y.o. male previously healthy and he decided to take 2 tablets of an OTC Weight Loss Supplement called Thermogenic Shred and and he had a very hard exercise workout the day prior to admission.  Subjective/ 24 H Interval events - transferred to floor from ICU last night, doing well, no complaints for me this morning  Assessment/Plan: Principal Problem:   Thrombocytopenia Active Problems:   Rhabdomyolysis   AKI (acute kidney injury)   T.T.P. syndrome   Microcytic anemia   TTP - patient with severe thrombocytopenia, schistocytes on smear, elevated LDH, bilirubin and D dimer - hematology consulted, appreciate input - he is getting daily PLEX and steroids, will need pheresis until platelets normalize - ADAMTS13 level pending.  - continue steroids, 100 mg daily   Rhabdomyolysis - improving with hydration  AKI - due to #1/2, stable, slowly improving  Left arm swelling - no weakness noted, his strength is good, has a hard time closing the fist due to swelling - DVT US pending - hold off on brain MRI as there is no appreciated weakness    Diet: regular Fluids: none DVT Prophylaxis: SCDs  Code Status: Full Family Communication: d/w patient  Disposition Plan: home when ready   Consultants:  Hematology   Procedures:  PLEX   Antibiotics  Anti-infectives    None      Studies DG chest 11/30 1. Right IJ line noted ending about the proximal SVC. 2. No acute cardiopulmonary process seen.  Objective  Filed Vitals:   06/17/14 2205 06/17/14 2333 06/18/14 0500 06/18/14 0539  BP: 155/82 134/75  123/76  Pulse: 72   75  Temp: 98.3 F (36.8 C) 99 F (37.2 C)  98.2 F (36.8 C)  TempSrc: Oral Oral  Oral  Resp: 16   18  Height:      Weight:   95 kg (209 lb 7 oz)   SpO2: 100%   100%    Intake/Output Summary (Last 24 hours) at 06/18/14 1223 Last data filed  at 06/18/14 1055  Gross per 24 hour  Intake    875 ml  Output   2900 ml  Net  -2025 ml   Filed Weights   06/16/14 1641 06/17/14 1105 06/18/14 0500  Weight: 109.6 kg (241 lb 10 oz) 94.802 kg (209 lb) 95 kg (209 lb 7 oz)   Exam:  General:  NAD  Cardiovascular: RRR  Respiratory: CTA biL  Abdomen: soft, non tender  MSK: 2+ upper extremity edema  Neuro: non focal, strength 5/5 in all 4  Data Reviewed: Basic Metabolic Panel:  Recent Labs Lab 06/16/14 0500  06/16/14 2210 06/17/14 0500 06/17/14 0930 06/17/14 1153 06/18/14 0655  NA 144  < > 142 142 144 143 145  K 3.6*  < > 3.9 4.3 4.3 4.1 4.3  CL 106  < > 102 104 105 104 105  CO2 24  --  26 27 28   --  25  GLUCOSE 100*  < > 163* 119* 117* 117* 100*  BUN 42*  < > 39* 39* 37* 35* 34*  CREATININE 2.45*  < > 2.28* 2.20* 2.11* 2.20* 1.92*  CALCIUM 8.7  --  10.1 9.2 9.3  --  9.2  MG  --   --   --   --   --   --  1.7  PHOS  --   --  1.9* 3.2  --   --  4.1  < > = values in this interval not displayed. Liver Function Tests:  Recent Labs Lab 06/15/14 2111 06/16/14 2210 06/17/14 0500 06/17/14 0930  AST 165*  --  42* 38*  ALT 57*  --  28 30  ALKPHOS 33*  --  38* 41  BILITOT 3.1*  --  1.0 0.8  PROT 7.3  --  5.5* 6.0  ALBUMIN 4.0 3.4* 3.1*  3.1* 3.3*   CBC:  Recent Labs Lab 06/15/14 1839  06/16/14 0500  06/16/14 2210 06/17/14 0500 06/17/14 0930 06/17/14 1153 06/18/14 0655  WBC 8.1  < > 6.5  --  12.3* 14.6* 15.0*  --  18.2*  NEUTROABS 4.7  --   --   --   --   --  11.8*  --   --   HGB 10.9*  < > 9.3*  < > 9.6* 8.3* 8.6* 8.8* 8.1*  HCT 32.5*  < > 28.3*  < > 29.3* 24.7* 26.3* 26.0* 24.9*  MCV 79.7  < > 80.2  --  79.4 78.4 79.2  --  79.8  PLT 9*  < > <5*  --  6* 14* 24*  --  47*  < > = values in this interval not displayed.  Cardiac Enzymes:  Recent Labs Lab 06/15/14 1839 06/16/14 2210 06/17/14 0500  CKTOTAL 5214* 1439* 915*   CBG:  Recent Labs Lab 06/16/14 0200 06/16/14 0823  GLUCAP 112* 108*     Recent Results (from the past 240 hour(s))  MRSA PCR Screening     Status: None   Collection Time: 06/16/14  2:10 AM  Result Value Ref Range Status   MRSA by PCR NEGATIVE NEGATIVE Final    Comment:        The GeneXpert MRSA Assay (FDA approved for NASAL specimens only), is one component of a comprehensive MRSA colonization surveillance program. It is not intended to diagnose MRSA infection nor to guide or monitor treatment for MRSA infections.      Scheduled Meds: . anticoagulant sodium citrate  5 mL Intracatheter Once  . pantoprazole (PROTONIX) IV  40 mg Intravenous Q24H  . potassium chloride  40 mEq Oral BID  . [START ON 06/19/2014] sodium chloride 0.9 % 25 mL with methylPREDNISolone sodium succinate (SOLU-MEDROL) 125 mg/2 mL 100 mg infusion   Intravenous Daily  . sucralfate  1 g Oral 4 times per day   Continuous Infusions: . sodium chloride 125 mL/hr at 06/18/14 0654  . citrate dextrose     Time spent: 35 minutes  Marzetta Board, MD [facility] Pager [phone]. If 7 PM - 7 AM, please contact night-coverage at [email or web address], password [facility] 06/18/2014, 12:23 PM  LOS: 3 days

## 2014-06-18 NOTE — Plan of Care (Signed)
Problem: Phase III Progression Outcomes Goal: Activity at appropriate level-compared to baseline (UP IN CHAIR FOR HEMODIALYSIS)  Outcome: Progressing

## 2014-06-18 NOTE — Progress Notes (Signed)
Kelly Bright   DOB:07/20/1970   KZ#:[id number]   TDD#:[id number]  Subjective: I spoke with him in the later afternoon yesterday and saw him this morning. He feels well overall, tolerating plasma exchange well. PLT up to 47K this morning, no bleeding. He noticed some left arm swelling and weakness. No numbness, no headaches. No fever or other neuro symptoms.    Objective:  Filed Vitals:   06/18/14 0539  BP: 123/76  Pulse: 75  Temp: 98.2 F (36.8 C)  Resp: 18    Body mass index is 25.5 kg/(m^2).  Intake/Output Summary (Last 24 hours) at 06/18/14 0837 Last data filed at 06/18/14 0457  Gross per 24 hour  Intake   1005 ml  Output   2875 ml  Net  -1870 ml     Sclerae unicteric  Oropharynx clear  No peripheral adenopathy             (+) right IJ venous catheter   Lungs clear -- no rales or rhonchi  Heart regular rate and rhythm  Abdomen benign  MSK no focal spinal tenderness, no peripheral edema  Neuro nonfocal  Ext: (+) left hand edema, and mild weakness on grabbing.    Labs:  Lab Results  Component Value Date   WBC 18.2* 06/18/2014   HGB 8.1* 06/18/2014   HCT 24.9* 06/18/2014   MCV 79.8 06/18/2014   PLT 47* 06/18/2014   NEUTROABS 11.8* 62/37/6283    Basic Metabolic Panel:  Recent Labs Lab 06/16/14 0500  06/16/14 2210 06/17/14 0500 06/17/14 0930 06/17/14 1153 06/18/14 0655  NA 144  < > 142 142 144 143 145  K 3.6*  < > 3.9 4.3 4.3 4.1 4.3  CL 106  < > 102 104 105 104 105  CO2 24  --  26 27 28   --  25  GLUCOSE 100*  < > 163* 119* 117* 117* 100*  BUN 42*  < > 39* 39* 37* 35* 34*  CREATININE 2.45*  < > 2.28* 2.20* 2.11* 2.20* 1.92*  CALCIUM 8.7  --  10.1 9.2 9.3  --  9.2  MG  --   --   --   --   --   --  1.7  PHOS  --   --  1.9* 3.2  --   --  4.1  < > = values in this interval not displayed. GFR Estimated Creatinine Clearance: 62.2 mL/min (by C-G formula based on Cr of 1.92). Liver Function Tests:  Recent Labs Lab 06/15/14 2111 06/16/14 2210 06/17/14 0500  06/17/14 0930  AST 165*  --  42* 38*  ALT 57*  --  28 30  ALKPHOS 33*  --  38* 41  BILITOT 3.1*  --  1.0 0.8  PROT 7.3  --  5.5* 6.0  ALBUMIN 4.0 3.4* 3.1*  3.1* 3.3*   No results for input(s): LIPASE, AMYLASE in the last 168 hours. No results for input(s): AMMONIA in the last 168 hours. Coagulation profile  Recent Labs Lab 06/15/14 2111 06/17/14 1100  INR 1.08 1.13    CBC:  Recent Labs Lab 06/15/14 1839  06/16/14 0500  06/16/14 2210 06/17/14 0500 06/17/14 0930 06/17/14 1153 06/18/14 0655  WBC 8.1  < > 6.5  --  12.3* 14.6* 15.0*  --  18.2*  NEUTROABS 4.7  --   --   --   --   --  11.8*  --   --   HGB 10.9*  < > 9.3*  < >  9.6* 8.3* 8.6* 8.8* 8.1*  HCT 32.5*  < > 28.3*  < > 29.3* 24.7* 26.3* 26.0* 24.9*  MCV 79.7  < > 80.2  --  79.4 78.4 79.2  --  79.8  PLT 9*  < > <5*  --  6* 14* 24*  --  47*  < > = values in this interval not displayed.  Studies:  Dg Chest Port 1 View  06/16/2014    IMPRESSION: 1. Right IJ line noted ending about the proximal SVC. 2. No acute cardiopulmonary process seen.   Electronically Signed   By: Garald Balding M.D.   On: 06/16/2014 03:04    Assessment:  43 year old African-American male, without significant past medical history, presented with fatigue, nausea vomiting, headache and extremity numbness, and severe thrombocytopenia and mild anemia.  1. Severe thrombocytopenia and anemia, probab TTP  -continue daily plasma exchange, with plasma volume 1.0 -Change Solumedro to 151m daily  2. Left hand edema and weakness  -Please obtain an [place] rule out DVT. Pt is at risk of thrombosis  -If [place], consider brain MRI to rule out ischemic stroke.   3. Rhabdomyolysis  -resolving   4. AKI: related to 1 and 3 -improving   Recommendation  -FFP exchange daily for now -change solu-medro to 1059mdaily   -please repeat daily lab including: CBC, CMP, indirct-bili, LDH, PT/INR and aPTT  -[place] rule out D[place]    Will follow  closely   [name] 06/18/2014  8:37 AM

## 2014-06-19 DIAGNOSIS — D696 Thrombocytopenia, unspecified: Secondary | ICD-10-CM

## 2014-06-19 LAB — THERAPEUTIC PLASMA EXCHANGE (BLOOD BANK)
PLASMA VOLUME NEEDED: 5000
UNIT DIVISION: 0
UNIT DIVISION: 0
UNIT DIVISION: 0
UNIT DIVISION: 0
UNIT DIVISION: 0
UNIT DIVISION: 0
Unit division: 0
Unit division: 0
Unit division: 0
Unit division: 0
Unit division: 0
Unit division: 0
Unit division: 0
Unit division: 0
Unit division: 0
Unit division: 0
Unit division: 0
Unit division: 0

## 2014-06-19 LAB — POCT I-STAT, CHEM 8
BUN: 29 mg/dL — ABNORMAL HIGH (ref 6–23)
CALCIUM ION: 1.17 mmol/L (ref 1.12–1.23)
Chloride: 101 mEq/L (ref 96–112)
Creatinine, Ser: 2.1 mg/dL — ABNORMAL HIGH (ref 0.50–1.35)
Glucose, Bld: 94 mg/dL (ref 70–99)
HCT: 23 % — ABNORMAL LOW (ref 39.0–52.0)
HEMOGLOBIN: 7.8 g/dL — AB (ref 13.0–17.0)
Potassium: 3.3 mEq/L — ABNORMAL LOW (ref 3.7–5.3)
Sodium: 146 mEq/L (ref 137–147)
TCO2: 28 mmol/L (ref 0–100)

## 2014-06-19 LAB — ADAMTS13 ACTIVITY: Adamts 13 Activity: <3 % Activity — ABNORMAL LOW (ref 68–163)

## 2014-06-19 LAB — CBC
HCT: 24.2 % — ABNORMAL LOW (ref 39.0–52.0)
Hemoglobin: 7.8 g/dL — ABNORMAL LOW (ref 13.0–17.0)
MCH: 26.6 pg (ref 26.0–34.0)
MCHC: 32.2 g/dL (ref 30.0–36.0)
MCV: 82.6 fL (ref 78.0–100.0)
Platelets: 100 10*3/uL — ABNORMAL LOW (ref 150–400)
RBC: 2.93 MIL/uL — AB (ref 4.22–5.81)
RDW: 15.8 % — ABNORMAL HIGH (ref 11.5–15.5)
WBC: 18.4 10*3/uL — ABNORMAL HIGH (ref 4.0–10.5)

## 2014-06-19 LAB — BASIC METABOLIC PANEL
ANION GAP: 11 (ref 5–15)
BUN: 30 mg/dL — AB (ref 6–23)
CHLORIDE: 107 meq/L (ref 96–112)
CO2: 30 mEq/L (ref 19–32)
Calcium: 8.7 mg/dL (ref 8.4–10.5)
Creatinine, Ser: 2 mg/dL — ABNORMAL HIGH (ref 0.50–1.35)
GFR calc non Af Amer: 40 mL/min — ABNORMAL LOW (ref >90)
GFR, EST AFRICAN AMERICAN: 46 mL/min — AB (ref >90)
Glucose, Bld: 85 mg/dL (ref 70–99)
POTASSIUM: 4.1 meq/L (ref 3.7–5.3)
SODIUM: 148 meq/L — AB (ref 137–147)

## 2014-06-19 LAB — HEPATIC FUNCTION PANEL
ALT: 24 U/L (ref 0–53)
AST: 24 U/L (ref 0–37)
Albumin: 3 g/dL — ABNORMAL LOW (ref 3.5–5.2)
Alkaline Phosphatase: 51 U/L (ref 39–117)
Bilirubin, Direct: <0.2 mg/dL (ref 0.0–0.3)
TOTAL PROTEIN: 5.3 g/dL — AB (ref 6.0–8.3)
Total Bilirubin: 0.3 mg/dL (ref 0.3–1.2)

## 2014-06-19 LAB — PROTIME-INR
INR: 1.19 (ref 0.00–1.49)
Prothrombin Time: 15.2 seconds (ref 11.6–15.2)

## 2014-06-19 LAB — LACTATE DEHYDROGENASE: LDH: 386 U/L — AB (ref 94–250)

## 2014-06-19 LAB — APTT: APTT: 24 s (ref 24–37)

## 2014-06-19 MED ORDER — ACD FORMULA A 0.73-2.45-2.2 GM/100ML VI SOLN
Status: AC
  Filled 2014-06-19: qty 500

## 2014-06-19 MED ORDER — METHYLPREDNISOLONE SODIUM SUCC 125 MG IJ SOLR
INTRAMUSCULAR | Status: AC
  Administered 2014-06-19: 25 mg via INTRAVENOUS
  Filled 2014-06-19: qty 2

## 2014-06-19 MED ORDER — GLYCERIN (LAXATIVE) 2.1 G RE SUPP
1.0000 | Freq: Every day | RECTAL | Status: DC | PRN
  Filled 2014-06-19: qty 1

## 2014-06-19 MED ORDER — DIPHENHYDRAMINE HCL 25 MG PO CAPS
ORAL_CAPSULE | ORAL | Status: AC
  Filled 2014-06-19: qty 1

## 2014-06-19 MED ORDER — METHYLPREDNISOLONE SODIUM SUCC 40 MG IJ SOLR
25.0000 mg | Freq: Once | INTRAMUSCULAR | Status: AC
  Administered 2014-06-19: 25 mg via INTRAVENOUS

## 2014-06-19 MED ORDER — ACD FORMULA A 0.73-2.45-2.2 GM/100ML VI SOLN
500.0000 mL | Status: DC
Start: 2014-06-19 — End: 2014-06-19
  Administered 2014-06-19: 500 mL via INTRAVENOUS
  Filled 2014-06-19: qty 500

## 2014-06-19 MED ORDER — ACETAMINOPHEN 325 MG PO TABS: 650.0000 mg | ORAL_TABLET | ORAL | Status: DC | PRN

## 2014-06-19 MED ORDER — CALCIUM GLUCONATE 10 % IV SOLN
4.0000 g | INTRAVENOUS | Status: AC
  Administered 2014-06-19: 4 g via INTRAVENOUS
  Filled 2014-06-19 (×2): qty 40

## 2014-06-19 MED ORDER — DIPHENHYDRAMINE HCL 25 MG PO CAPS: 25.0000 mg | ORAL_CAPSULE | Freq: Four times a day (QID) | ORAL | Status: DC | PRN

## 2014-06-19 MED ORDER — CALCIUM CARBONATE ANTACID 500 MG PO CHEW
2.0000 | CHEWABLE_TABLET | ORAL | Status: DC
  Administered 2014-06-19: 400 mg via ORAL
  Filled 2014-06-19 (×2): qty 2

## 2014-06-19 MED ORDER — CALCIUM CARBONATE ANTACID 500 MG PO CHEW
CHEWABLE_TABLET | ORAL | Status: AC
  Filled 2014-06-19: qty 2

## 2014-06-19 MED ORDER — DIPHENHYDRAMINE HCL 50 MG/ML IJ SOLN
INTRAMUSCULAR | Status: AC
  Filled 2014-06-19: qty 1

## 2014-06-19 MED ORDER — ANTICOAGULANT SODIUM CITRATE 4% (200MG/5ML) IV SOLN
5.0000 mL | Status: AC
  Administered 2014-06-19: 5 mL
  Filled 2014-06-19 (×2): qty 250

## 2014-06-19 MED ORDER — DIPHENHYDRAMINE HCL 50 MG/ML IJ SOLN
25.0000 mg | Freq: Once | INTRAMUSCULAR | Status: AC
  Administered 2014-06-19: 25 mg via INTRAVENOUS

## 2014-06-19 MED ORDER — SENNOSIDES-DOCUSATE SODIUM 8.6-50 MG PO TABS
1.0000 | ORAL_TABLET | Freq: Two times a day (BID) | ORAL | Status: DC
  Administered 2014-06-19 – 2014-06-23 (×6): 1 via ORAL
  Filled 2014-06-19 (×7): qty 1

## 2014-06-19 NOTE — Progress Notes (Signed)
Bilateral upper extremity venous duplex completed:  No evidence of DVT or superficial thrombosis.

## 2014-06-19 NOTE — Plan of Care (Signed)
Problem: Discharge Progression Outcomes Goal: Pain controlled with appropriate interventions Outcome: Progressing

## 2014-06-19 NOTE — Progress Notes (Signed)
PROGRESS NOTE  Kelly Bright YJE:[id number] DOB: 01-06-1971 DOA: 06/15/2014 PCP: Philis Fendt, MD  HPI: 43 y.o. male previously healthy and he decided to take 2 tablets of an OTC Weight Loss Supplement called Thermogenic Shred and and he had a very hard exercise workout the day prior to admission.  Subjective/ 24 H Interval events - feels well, appreciates less swelling left arm   Assessment/Plan: Principal Problem:   Thrombocytopenia Active Problems:   Rhabdomyolysis   AKI (acute kidney injury)   T.T.P. syndrome   Microcytic anemia   TTP - patient with severe thrombocytopenia, schistocytes on smear, elevated LDH, bilirubin and D dimer - hematology consulted, appreciate input - he is getting daily PLEX and steroids, will need pheresis until platelets normalize - ADAMTS13 level pending.  - continue steroids, 100 mg daily  - will transition to po prednisone per heme - platelets improving to 100 K this morning  Rhabdomyolysis - improving with hydration  AKI - due to #1/2, stable, slowly improving  Left arm swelling - no weakness noted, his strength is good, has a hard time closing the fist due to swelling - DVT US pending - hold off on brain MRI as there is no appreciated weakness    Diet: regular Fluids: none DVT Prophylaxis: SCDs  Code Status: Full Family Communication: d/w patient  Disposition Plan: home when ready   Consultants:  Hematology   Procedures:  PLEX   Antibiotics  Anti-infectives    None      Studies DG chest 11/30 1. Right IJ line noted ending about the proximal SVC. 2. No acute cardiopulmonary process seen.  Objective  Filed Vitals:   06/19/14 1335 06/19/14 1343 06/19/14 1349 06/19/14 1405  BP: 145/80 146/80 138/81 149/79  Pulse: 78 78 96 74  Temp: 97.8 F (36.6 C) 97.6 F (36.4 C) 97.5 F (36.4 C)   TempSrc: Oral Oral Oral Oral  Resp:    18  Height:      Weight:      SpO2:    97%    Intake/Output Summary (Last 24  hours) at 06/19/14 1411 Last data filed at 06/19/14 1026  Gross per 24 hour  Intake 4935.42 ml  Output    925 ml  Net 4010.42 ml   Filed Weights   06/16/14 1641 06/17/14 1105 06/18/14 0500  Weight: 109.6 kg (241 lb 10 oz) 94.802 kg (209 lb) 95 kg (209 lb 7 oz)   Exam:  General:  NAD  Cardiovascular: RRR  Respiratory: CTA biL  Abdomen: soft, non tender  MSK: 2+ upper extremity edema  Neuro: non focal, strength 5/5 in all 4  Data Reviewed: Basic Metabolic Panel:  Recent Labs Lab 06/16/14 2210 06/17/14 0500 06/17/14 0930 06/17/14 1153 06/18/14 0655 06/18/14 1459 06/19/14 0543 06/19/14 1110  NA 142 142 144 143 145 143 148* 146  K 3.9 4.3 4.3 4.1 4.3 3.8 4.1 3.3*  CL 102 104 105 104 105 102 107 101  CO2 26 27 28   --  25  --  30  --   GLUCOSE 163* 119* 117* 117* 100* 133* 85 94  BUN 39* 39* 37* 35* 34* 32* 30* 29*  CREATININE 2.28* 2.20* 2.11* 2.20* 1.92* 1.90* 2.00* 2.10*  CALCIUM 10.1 9.2 9.3  --  9.2  --  8.7  --   MG  --   --   --   --  1.7  --   --   --   PHOS 1.9* 3.2  --   --  4.1  --   --   --    Liver Function Tests:  Recent Labs Lab 06/15/14 2111 06/16/14 2210 06/17/14 0500 06/17/14 0930 06/19/14 0543  AST 165*  --  42* 38* 24  ALT 57*  --  28 30 24   ALKPHOS 33*  --  38* 41 51  BILITOT 3.1*  --  1.0 0.8 0.3  PROT 7.3  --  5.5* 6.0 5.3*  ALBUMIN 4.0 3.4* 3.1*  3.1* 3.3* 3.0*   CBC:  Recent Labs Lab 06/15/14 1839  06/16/14 2210 06/17/14 0500 06/17/14 0930 06/17/14 1153 06/18/14 0655 06/18/14 1459 06/19/14 0543 06/19/14 1110  WBC 8.1  < > 12.3* 14.6* 15.0*  --  18.2*  --  18.4*  --   NEUTROABS 4.7  --   --   --  11.8*  --   --   --   --   --   HGB 10.9*  < > 9.6* 8.3* 8.6* 8.8* 8.1* 8.8* 7.8* 7.8*  HCT 32.5*  < > 29.3* 24.7* 26.3* 26.0* 24.9* 26.0* 24.2* 23.0*  MCV 79.7  < > 79.4 78.4 79.2  --  79.8  --  82.6  --   PLT 9*  < > 6* 14* 24*  --  47*  --  100*  --   < > = values in this interval not displayed.  Cardiac  Enzymes:  Recent Labs Lab 06/15/14 1839 06/16/14 2210 06/17/14 0500  CKTOTAL 5214* 1439* 915*   CBG:  Recent Labs Lab 06/16/14 0200 06/16/14 0823  GLUCAP 112* 108*    Recent Results (from the past 240 hour(s))  MRSA PCR Screening     Status: None   Collection Time: 06/16/14  2:10 AM  Result Value Ref Range Status   MRSA by PCR NEGATIVE NEGATIVE Final    Comment:        The GeneXpert MRSA Assay (FDA approved for NASAL specimens only), is one component of a comprehensive MRSA colonization surveillance program. It is not intended to diagnose MRSA infection nor to guide or monitor treatment for MRSA infections.      Scheduled Meds: . anticoagulant sodium citrate  5 mL Intracatheter To Hemo  . calcium carbonate      . calcium carbonate  2 tablet Oral Q3H  . calcium gluconate IVPB  4 g Intravenous To Hemo  . citrate dextrose      . citrate dextrose      . diphenhydrAMINE      . diphenhydrAMINE      . methylPREDNISolone (SOLU-MEDROL) injection  25 mg Intravenous Once  . pantoprazole (PROTONIX) IV  40 mg Intravenous Q24H  . potassium chloride  40 mEq Oral BID  . senna-docusate  1 tablet Oral BID  . sodium chloride 0.9 % 25 mL with methylPREDNISolone sodium succinate (SOLU-MEDROL) 125 mg/2 mL 100 mg infusion   Intravenous Daily  . sucralfate  1 g Oral 4 times per day   Continuous Infusions: . sodium chloride 125 mL/hr at 06/19/14 0629  . citrate dextrose    . citrate dextrose     Time spent: 25 minutes  Marzetta Board, MD [facility] Pager [phone]. If 7 PM - 7 AM, please contact night-coverage at [email or web address], password [facility] 06/19/2014, 2:11 PM  LOS: 4 days

## 2014-06-20 LAB — CBC WITH DIFFERENTIAL/PLATELET
Basophils Absolute: 0 10*3/uL (ref 0.0–0.1)
Basophils Relative: 0 % (ref 0–1)
EOS ABS: 0.1 10*3/uL (ref 0.0–0.7)
Eosinophils Relative: 1 % (ref 0–5)
HCT: 23.6 % — ABNORMAL LOW (ref 39.0–52.0)
Hemoglobin: 7.5 g/dL — ABNORMAL LOW (ref 13.0–17.0)
LYMPHS ABS: 3.1 10*3/uL (ref 0.7–4.0)
Lymphocytes Relative: 27 % (ref 12–46)
MCH: 26.2 pg (ref 26.0–34.0)
MCHC: 31.8 g/dL (ref 30.0–36.0)
MCV: 82.5 fL (ref 78.0–100.0)
Monocytes Absolute: 0.7 10*3/uL (ref 0.1–1.0)
Monocytes Relative: 6 % (ref 3–12)
NEUTROS PCT: 66 % (ref 43–77)
Neutro Abs: 7.7 10*3/uL (ref 1.7–7.7)
PLATELETS: 160 10*3/uL (ref 150–400)
RBC: 2.86 MIL/uL — AB (ref 4.22–5.81)
RDW: 15.5 % (ref 11.5–15.5)
WBC: 11.7 10*3/uL — AB (ref 4.0–10.5)

## 2014-06-20 LAB — THERAPEUTIC PLASMA EXCHANGE (BLOOD BANK)
Plasma volume needed: 5000
UNIT DIVISION: 0
UNIT DIVISION: 0
UNIT DIVISION: 0
UNIT DIVISION: 0
UNIT DIVISION: 0
UNIT DIVISION: 0
UNIT DIVISION: 0
UNIT DIVISION: 0
UNIT DIVISION: 0
Unit division: 0
Unit division: 0
Unit division: 0
Unit division: 0
Unit division: 0
Unit division: 0
Unit division: 0
Unit division: 0
Unit division: 0

## 2014-06-20 LAB — HEPATIC FUNCTION PANEL
ALT: 21 U/L (ref 0–53)
AST: 20 U/L (ref 0–37)
Albumin: 3.1 g/dL — ABNORMAL LOW (ref 3.5–5.2)
Alkaline Phosphatase: 47 U/L (ref 39–117)
TOTAL PROTEIN: 5.6 g/dL — AB (ref 6.0–8.3)
Total Bilirubin: 0.3 mg/dL (ref 0.3–1.2)

## 2014-06-20 LAB — LACTATE DEHYDROGENASE: LDH: 285 U/L — AB (ref 94–250)

## 2014-06-20 MED ORDER — DIPHENHYDRAMINE HCL 25 MG PO CAPS
25.0000 mg | ORAL_CAPSULE | Freq: Four times a day (QID) | ORAL | Status: DC | PRN
  Administered 2014-06-20: 25 mg via ORAL

## 2014-06-20 MED ORDER — PANTOPRAZOLE SODIUM 40 MG PO TBEC
40.0000 mg | DELAYED_RELEASE_TABLET | Freq: Every day | ORAL | Status: DC
  Administered 2014-06-21 – 2014-06-23 (×3): 40 mg via ORAL
  Filled 2014-06-20 (×2): qty 1

## 2014-06-20 MED ORDER — ANTICOAGULANT SODIUM CITRATE 4% (200MG/5ML) IV SOLN: 5.0000 mL | Freq: Once | Status: DC

## 2014-06-20 MED ORDER — ANTICOAGULANT SODIUM CITRATE 4% (200MG/5ML) IV SOLN
5.0000 mL | Status: DC
Start: 2014-06-20 — End: 2014-06-20
  Filled 2014-06-20: qty 250

## 2014-06-20 MED ORDER — ACD FORMULA A 0.73-2.45-2.2 GM/100ML VI SOLN
500.0000 mL | Status: DC
  Administered 2014-06-20: 500 mL via INTRAVENOUS
  Filled 2014-06-20: qty 500

## 2014-06-20 MED ORDER — CALCIUM CARBONATE ANTACID 500 MG PO CHEW
2.0000 | CHEWABLE_TABLET | ORAL | Status: AC
  Administered 2014-06-20: 400 mg via ORAL
  Filled 2014-06-20 (×2): qty 2

## 2014-06-20 MED ORDER — CALCIUM GLUCONATE 10 % IV SOLN
4.0000 g | INTRAVENOUS | Status: AC
  Administered 2014-06-20: 4 g via INTRAVENOUS
  Filled 2014-06-20 (×2): qty 40

## 2014-06-20 MED ORDER — ACETAMINOPHEN 325 MG PO TABS: 650.0000 mg | ORAL_TABLET | ORAL | Status: DC | PRN

## 2014-06-20 MED ORDER — SODIUM CHLORIDE 0.9 % IV SOLN: 4.0000 g | Freq: Once | INTRAVENOUS | Status: DC

## 2014-06-20 MED ORDER — SODIUM CHLORIDE 0.9 % IV SOLN
4.0000 g | Freq: Once | INTRAVENOUS | Status: AC
  Administered 2014-06-20: 4 g via INTRAVENOUS
  Filled 2014-06-20: qty 40

## 2014-06-20 MED ORDER — METHYLPREDNISOLONE SODIUM SUCC 125 MG IJ SOLR
80.0000 mg | Freq: Every day | INTRAMUSCULAR | Status: DC | PRN
  Administered 2014-06-19: 25 mg via INTRAVENOUS
  Administered 2014-06-20 (×3): 80 mg via INTRAVENOUS

## 2014-06-20 MED ORDER — DIPHENHYDRAMINE HCL 50 MG/ML IJ SOLN
25.0000 mg | Freq: Every day | INTRAMUSCULAR | Status: DC | PRN
  Administered 2014-06-20: 25 mg via INTRAVENOUS

## 2014-06-20 MED ORDER — ACD FORMULA A 0.73-2.45-2.2 GM/100ML VI SOLN
Status: AC
  Filled 2014-06-20: qty 1000

## 2014-06-20 NOTE — Progress Notes (Signed)
PROGRESS NOTE  Kelly Bright ZOX:[id number] DOB: 1971/06/27 DOA: 06/15/2014 PCP: Philis Fendt, MD  HPI: 43 y.o. male previously healthy and he decided to take 2 tablets of an OTC Weight Loss Supplement called Thermogenic Shred and and he had a very hard exercise workout the day prior to admission.  Subjective/ 24 H Interval events - no complaints this morning  Assessment/Plan: Principal Problem:   Thrombocytopenia Active Problems:   Rhabdomyolysis   AKI (acute kidney injury)   T.T.P. syndrome   Microcytic anemia   TTP - patient with severe thrombocytopenia, schistocytes on smear, elevated LDH, bilirubin and D dimer - hematology consulted, appreciate input - he is getting daily PLEX and steroids, will need pheresis until platelets normalize - ADAMTS13 level pending.  - continue steroids - platelets improving, >150 K today   Rhabdomyolysis  AKI - due to #1/2, stable, wonder if he will have a new baseline   Left arm swelling - no weakness noted, his strength is good, has a hard time closing the fist due to swelling - DVT US negative   Diet: regular Fluids: none DVT Prophylaxis: SCDs  Code Status: Full Family Communication: d/w patient  Disposition Plan: home when ready   Consultants:  Hematology   Procedures:  PLEX   Antibiotics  Anti-infectives    None      Studies DG chest 11/30 1. Right IJ line noted ending about the proximal SVC. 2. No acute cardiopulmonary process seen.  Objective  Filed Vitals:   06/19/14 1435 06/19/14 2210 06/20/14 0500 06/20/14 0700  BP: 150/80 142/72 130/74   Pulse: 74  70   Temp: 98.9 F (37.2 C) 98.4 F (36.9 C) 98.3 F (36.8 C)   TempSrc: Oral Oral Oral   Resp: 15 16 18    Height:      Weight:    122.3 kg (269 lb 10 oz)  SpO2: 99% 99% 98%     Intake/Output Summary (Last 24 hours) at 06/20/14 1240 Last data filed at 06/20/14 0814  Gross per 24 hour  Intake   1865 ml  Output   2750 ml  Net   -885 ml    Filed Weights   06/17/14 1105 06/18/14 0500 06/20/14 0700  Weight: 94.802 kg (209 lb) 95 kg (209 lb 7 oz) 122.3 kg (269 lb 10 oz)   Exam:  General:  NAD  Cardiovascular: RRR  Respiratory: CTA biL  Abdomen: soft, non tender  MSK: 2+ upper extremity edema  Neuro: non focal, strength 5/5 in all 4  Data Reviewed: Basic Metabolic Panel:  Recent Labs Lab 06/16/14 2210 06/17/14 0500 06/17/14 0930 06/17/14 1153 06/18/14 0655 06/18/14 1459 06/19/14 0543 06/19/14 1110  NA 142 142 144 143 145 143 148* 146  K 3.9 4.3 4.3 4.1 4.3 3.8 4.1 3.3*  CL 102 104 105 104 105 102 107 101  CO2 26 27 28   --  25  --  30  --   GLUCOSE 163* 119* 117* 117* 100* 133* 85 94  BUN 39* 39* 37* 35* 34* 32* 30* 29*  CREATININE 2.28* 2.20* 2.11* 2.20* 1.92* 1.90* 2.00* 2.10*  CALCIUM 10.1 9.2 9.3  --  9.2  --  8.7  --   MG  --   --   --   --  1.7  --   --   --   PHOS 1.9* 3.2  --   --  4.1  --   --   --    Liver Function  Tests:  Recent Labs Lab 06/15/14 2111 06/16/14 2210 06/17/14 0500 06/17/14 0930 06/19/14 0543 06/20/14 1000  AST 165*  --  42* 38* 24 20  ALT 57*  --  28 30 24 21   ALKPHOS 33*  --  38* 41 51 47  BILITOT 3.1*  --  1.0 0.8 0.3 0.3  PROT 7.3  --  5.5* 6.0 5.3* 5.6*  ALBUMIN 4.0 3.4* 3.1*  3.1* 3.3* 3.0* 3.1*   CBC:  Recent Labs Lab 06/15/14 1839  06/17/14 0500 06/17/14 0930  06/18/14 0655 06/18/14 1459 06/19/14 0543 06/19/14 1110 06/20/14 1000  WBC 8.1  < > 14.6* 15.0*  --  18.2*  --  18.4*  --  11.7*  NEUTROABS 4.7  --   --  11.8*  --   --   --   --   --  7.7  HGB 10.9*  < > 8.3* 8.6*  < > 8.1* 8.8* 7.8* 7.8* 7.5*  HCT 32.5*  < > 24.7* 26.3*  < > 24.9* 26.0* 24.2* 23.0* 23.6*  MCV 79.7  < > 78.4 79.2  --  79.8  --  82.6  --  82.5  PLT 9*  < > 14* 24*  --  47*  --  100*  --  160  < > = values in this interval not displayed.  Cardiac Enzymes:  Recent Labs Lab 06/15/14 1839 06/16/14 2210 06/17/14 0500  CKTOTAL 5214* 1439* 915*   CBG:  Recent  Labs Lab 06/16/14 0200 06/16/14 0823  GLUCAP 112* 108*   Recent Results (from the past 240 hour(s))  MRSA PCR Screening     Status: None   Collection Time: 06/16/14  2:10 AM  Result Value Ref Range Status   MRSA by PCR NEGATIVE NEGATIVE Final    Comment:        The GeneXpert MRSA Assay (FDA approved for NASAL specimens only), is one component of a comprehensive MRSA colonization surveillance program. It is not intended to diagnose MRSA infection nor to guide or monitor treatment for MRSA infections.      Scheduled Meds: . anticoagulant sodium citrate  5 mL Intracatheter To Hemo  . calcium gluconate IVPB  4 g Intravenous To Hemo  . pantoprazole (PROTONIX) IV  40 mg Intravenous Q24H  . potassium chloride  40 mEq Oral BID  . senna-docusate  1 tablet Oral BID  . sodium chloride 0.9 % 25 mL with methylPREDNISolone sodium succinate (SOLU-MEDROL) 125 mg/2 mL 100 mg infusion   Intravenous Daily  . sucralfate  1 g Oral 4 times per day   Continuous Infusions: . sodium chloride 125 mL/hr at 06/20/14 0310   Time spent: 15 minutes  Marzetta Board, MD [facility] Pager [phone]. If 7 PM - 7 AM, please contact night-coverage at [email or web address], password [facility] 06/20/2014, 12:40 PM  LOS: 5 days

## 2014-06-20 NOTE — Progress Notes (Addendum)
Kelly Bright   DOB:03-Jul-1971   VW#:[id number]   WGN#:[id number]  Subjective: Saw him in HD unit today before his plasma exchange. He had some reaction during the exchange yesterday, skin itching, no rash, got better with iv benadryl and solumedro 100m iv. No fever or chills. His Hb down to 7.5 today, plt recovered.    Objective:  Filed Vitals:   06/20/14 1519  BP: 143/80  Pulse: 88  Temp: 98.4 F (36.9 C)  Resp: 20    Body mass index is 32.83 kg/(m^2).  Intake/Output Summary (Last 24 hours) at 06/20/14 1812 Last data filed at 06/20/14 1543  Gross per 24 hour  Intake   1625 ml  Output   1350 ml  Net    275 ml     Sclerae unicteric  Oropharynx clear  No peripheral adenopathy             (+) right IJ venous catheter   Lungs clear -- no rales or rhonchi  Heart regular rate and rhythm  Abdomen benign  MSK no focal spinal tenderness, no peripheral edema  Neuro nonfocal  Ext: (+) left hand edema, and mild weakness on grabbing.    Labs:  Lab Results  Component Value Date   WBC 11.7* 06/20/2014   HGB 7.5* 06/20/2014   HCT 23.6* 06/20/2014   MCV 82.5 06/20/2014   PLT 160 06/20/2014   NEUTROABS 7.7 178/46/9629   Basic Metabolic Panel:  Recent Labs Lab 06/16/14 2210 06/17/14 0500 06/17/14 0930 06/17/14 1153 06/18/14 0655 06/18/14 1459 06/19/14 0543 06/19/14 1110  NA 142 142 144 143 145 143 148* 146  K 3.9 4.3 4.3 4.1 4.3 3.8 4.1 3.3*  CL 102 104 105 104 105 102 107 101  CO2 26 27 28   --  25  --  30  --   GLUCOSE 163* 119* 117* 117* 100* 133* 85 94  BUN 39* 39* 37* 35* 34* 32* 30* 29*  CREATININE 2.28* 2.20* 2.11* 2.20* 1.92* 1.90* 2.00* 2.10*  CALCIUM 10.1 9.2 9.3  --  9.2  --  8.7  --   MG  --   --   --   --  1.7  --   --   --   PHOS 1.9* 3.2  --   --  4.1  --   --   --    GFR Estimated Creatinine Clearance: 66.1 mL/min (by C-G formula based on Cr of 2.1). Liver Function Tests:  Recent Labs Lab 06/15/14 2111 06/16/14 2210 06/17/14 0500 06/17/14 0930  06/19/14 0543 06/20/14 1000  AST 165*  --  42* 38* 24 20  ALT 57*  --  28 30 24 21   ALKPHOS 33*  --  38* Kelly 51 47  BILITOT 3.1*  --  1.0 0.8 0.3 0.3  PROT 7.3  --  5.5* 6.0 5.3* 5.6*  ALBUMIN 4.0 3.4* 3.1*  3.1* 3.3* 3.0* 3.1*   No results for input(s): LIPASE, AMYLASE in the last 168 hours. No results for input(s): AMMONIA in the last 168 hours. Coagulation profile  Recent Labs Lab 06/15/14 2111 06/17/14 1100 06/19/14 0543  INR 1.08 1.13 1.19    CBC:  Recent Labs Lab 06/15/14 1839  06/17/14 0500 06/17/14 0930  06/18/14 0655 06/18/14 1459 06/19/14 0543 06/19/14 1110 06/20/14 1000  WBC 8.1  < > 14.6* 15.0*  --  18.2*  --  18.4*  --  11.7*  NEUTROABS 4.7  --   --  11.8*  --   --   --   --   --  7.7  HGB 10.9*  < > 8.3* 8.6*  < > 8.1* 8.8* 7.8* 7.8* 7.5*  HCT 32.5*  < > 24.7* 26.3*  < > 24.9* 26.0* 24.2* 23.0* 23.6*  MCV 79.7  < > 78.4 79.2  --  79.8  --  82.6  --  82.5  PLT 9*  < > 14* 24*  --  47*  --  100*  --  160  < > = values in this interval not displayed.  Studies:  Dg Chest Port 1 View  06/16/2014    IMPRESSION: 1. Right IJ line noted ending about the proximal SVC. 2. No acute cardiopulmonary process seen.   Electronically Signed   By: Garald Balding M.D.   On: 06/16/2014 03:04    Assessment:  Kelly Bright, without significant past medical history, presented with fatigue, nausea vomiting, headache and extremity numbness, and severe thrombocytopenia and mild anemia.  1. Severe thrombocytopenia and anemia, probab TTP  -hold on plasma exchange tomorrow. If plt drops significantly again on Sunday, then resume plasma exchange with volume 1.0 -Change Solumedro to 33m daily. Consider change it to po prednisone 1038mdaily on Sunday if plt remains stable.  -(+) family history of rheumatological disease, please check ANA, RA, ESR, p-ANCA.   2. Left hand edema and weakness  -[place] for DVT, no weakness or other neuro deficient on exam  .   3. Rhabdomyolysis  -resolving   4. AKI: related to 1 and 3 -stable  5. Anemia -discussed blood transfusion, he agrees. Please give 2u RBC tomorrow   My oncall colleague will follow him on weekend. I am available by phone [phone] needed.   [name] 06/20/2014  6:12 PM

## 2014-06-20 NOTE — Plan of Care (Signed)
Problem: Phase III Progression Outcomes Goal: Activity at appropriate level-compared to baseline (UP IN CHAIR FOR HEMODIALYSIS)  Outcome: Progressing

## 2014-06-21 LAB — THERAPEUTIC PLASMA EXCHANGE (BLOOD BANK)
Plasma volume needed: 5000
UNIT DIVISION: 0
UNIT DIVISION: 0
UNIT DIVISION: 0
UNIT DIVISION: 0
UNIT DIVISION: 0
UNIT DIVISION: 0
UNIT DIVISION: 0
Unit division: 0
Unit division: 0
Unit division: 0
Unit division: 0
Unit division: 0
Unit division: 0
Unit division: 0
Unit division: 0
Unit division: 0
Unit division: 0
Unit division: 0
Unit division: 0

## 2014-06-21 LAB — BASIC METABOLIC PANEL
ANION GAP: 11 (ref 5–15)
BUN: 28 mg/dL — ABNORMAL HIGH (ref 6–23)
CHLORIDE: 103 meq/L (ref 96–112)
CO2: 32 mEq/L (ref 19–32)
Calcium: 8.8 mg/dL (ref 8.4–10.5)
Creatinine, Ser: 1.7 mg/dL — ABNORMAL HIGH (ref 0.50–1.35)
GFR calc Af Amer: 56 mL/min — ABNORMAL LOW (ref >90)
GFR calc non Af Amer: 48 mL/min — ABNORMAL LOW (ref >90)
Glucose, Bld: 108 mg/dL — ABNORMAL HIGH (ref 70–99)
Potassium: 3.9 mEq/L (ref 3.7–5.3)
SODIUM: 146 meq/L (ref 137–147)

## 2014-06-21 LAB — SEDIMENTATION RATE: Sed Rate: 10 mm/hr (ref 0–16)

## 2014-06-21 LAB — CBC
HCT: 22.9 % — ABNORMAL LOW (ref 39.0–52.0)
Hemoglobin: 7.3 g/dL — ABNORMAL LOW (ref 13.0–17.0)
MCH: 26.4 pg (ref 26.0–34.0)
MCHC: 31.9 g/dL (ref 30.0–36.0)
MCV: 83 fL (ref 78.0–100.0)
Platelets: 205 10*3/uL (ref 150–400)
RBC: 2.76 MIL/uL — ABNORMAL LOW (ref 4.22–5.81)
RDW: 15.7 % — ABNORMAL HIGH (ref 11.5–15.5)
WBC: 13.2 10*3/uL — ABNORMAL HIGH (ref 4.0–10.5)

## 2014-06-21 LAB — HEPATIC FUNCTION PANEL
ALT: 20 U/L (ref 0–53)
AST: 18 U/L (ref 0–37)
Albumin: 2.9 g/dL — ABNORMAL LOW (ref 3.5–5.2)
Alkaline Phosphatase: 51 U/L (ref 39–117)
Total Bilirubin: 0.2 mg/dL — ABNORMAL LOW (ref 0.3–1.2)
Total Protein: 5.4 g/dL — ABNORMAL LOW (ref 6.0–8.3)

## 2014-06-21 LAB — PREPARE RBC (CROSSMATCH)

## 2014-06-21 MED ORDER — SODIUM CHLORIDE 0.9 % IV SOLN
Freq: Every day | INTRAVENOUS | Status: DC
  Administered 2014-06-21: 09:00:00 via INTRAVENOUS
  Filled 2014-06-21 (×2): qty 25

## 2014-06-21 MED ORDER — SODIUM CHLORIDE 0.9 % IV SOLN
Freq: Once | INTRAVENOUS | Status: AC
  Administered 2014-06-21: 13:00:00 via INTRAVENOUS

## 2014-06-21 MED ORDER — ACETAMINOPHEN 325 MG PO TABS: 650.0000 mg | ORAL_TABLET | Freq: Four times a day (QID) | ORAL | Status: DC | PRN

## 2014-06-21 MED ORDER — PANTOPRAZOLE SODIUM 40 MG PO TBEC
DELAYED_RELEASE_TABLET | ORAL | Status: AC
  Filled 2014-06-21: qty 1

## 2014-06-21 NOTE — Progress Notes (Signed)
Heard from IV team about drawing labs for type and screen and was told they could not draw labs due to the order not being from a renal doctor. Even though I explained that the hematologist was who was seeing him due to the plasma exchange, the IV team felt that they were unable to do so. I let Dr. Cruzita Lederer know about this situation and repaged IV team to speak with him regarding issue. Eventually, situation was resolved and blood was drawn for type and screen. Will begin transfusion when blood bank notifies me of it being ready.

## 2014-06-21 NOTE — Progress Notes (Signed)
PROGRESS NOTE  Kelly Bright IOE:[id number] DOB: September 08, 1970 DOA: 06/15/2014 PCP: Philis Fendt, MD  HPI: 43 y.o. male previously healthy and he decided to take 2 tablets of an OTC Weight Loss Supplement called Thermogenic Shred and and he had a very hard exercise workout the day prior to admission.  Subjective/ 24 H Interval events - no complaints this morning  Assessment/Plan: Principal Problem:   Thrombocytopenia Active Problems:   Rhabdomyolysis   AKI (acute kidney injury)   T.T.P. syndrome   Microcytic anemia    TTP - patient with severe thrombocytopenia, schistocytes on smear, elevated LDH, bilirubin and D dimer - hematology consulted, appreciate input - he is getting daily PLEX and steroids, no PLEX 12/5 - ADAMTS13 level very low  - continue steroids - platelets improving, >150 K today   Anemia  - in the setting of #1, transfuse 2U pRBC today   Rhabdomyolysis  AKI - due to #1/2, stable, wonder if he will have a new baseline   Left arm swelling - no weakness noted, his strength is good, has a hard time closing the fist due to swelling - DVT US negative   Diet: regular Fluids: none DVT Prophylaxis: SCDs   Code Status: Full Family Communication: d/w patient and his family bedside Disposition Plan: home when ready   Consultants:  Hematology   Procedures:  PLEX   Antibiotics  Anti-infectives    None      Studies DG chest 11/30 1. Right IJ line noted ending about the proximal SVC. 2. No acute cardiopulmonary process seen.  Objective  Filed Vitals:   06/20/14 1519 06/20/14 1835 06/20/14 2311 06/21/14 0532  BP: 143/80 157/84 143/71 146/85  Pulse: 88 80 93 82  Temp: 98.4 F (36.9 C) 98.9 F (37.2 C) 98.9 F (37.2 C) 98.4 F (36.9 C)  TempSrc: Oral Oral Oral Oral  Resp: 20 20 18 18   Height:      Weight:  122.3 kg (269 lb 10 oz)    SpO2: 100% 100% 100% 100%    Intake/Output Summary (Last 24 hours) at 06/21/14 0743 Last data filed  at 06/21/14 1829  Gross per 24 hour  Intake    910 ml  Output   1075 ml  Net   -165 ml   Filed Weights   06/18/14 0500 06/20/14 0700 06/20/14 1835  Weight: 95 kg (209 lb 7 oz) 122.3 kg (269 lb 10 oz) 122.3 kg (269 lb 10 oz)   Exam:  General:  NAD  Cardiovascular: RRR  Respiratory: CTA biL  Abdomen: soft, non tender  MSK: 2+ upper extremity edema  Neuro: non focal, strength 5/5 in all 4  Data Reviewed: Basic Metabolic Panel:  Recent Labs Lab 06/16/14 2210 06/17/14 0500 06/17/14 0930 06/17/14 1153 06/18/14 0655 06/18/14 1459 06/19/14 0543 06/19/14 1110  NA 142 142 144 143 145 143 148* 146  K 3.9 4.3 4.3 4.1 4.3 3.8 4.1 3.3*  CL 102 104 105 104 105 102 107 101  CO2 26 27 28   --  25  --  30  --   GLUCOSE 163* 119* 117* 117* 100* 133* 85 94  BUN 39* 39* 37* 35* 34* 32* 30* 29*  CREATININE 2.28* 2.20* 2.11* 2.20* 1.92* 1.90* 2.00* 2.10*  CALCIUM 10.1 9.2 9.3  --  9.2  --  8.7  --   MG  --   --   --   --  1.7  --   --   --  PHOS 1.9* 3.2  --   --  4.1  --   --   --    Liver Function Tests:  Recent Labs Lab 06/15/14 2111 06/16/14 2210 06/17/14 0500 06/17/14 0930 06/19/14 0543 06/20/14 1000  AST 165*  --  42* 38* 24 20  ALT 57*  --  28 30 24 21   ALKPHOS 33*  --  38* 41 51 47  BILITOT 3.1*  --  1.0 0.8 0.3 0.3  PROT 7.3  --  5.5* 6.0 5.3* 5.6*  ALBUMIN 4.0 3.4* 3.1*  3.1* 3.3* 3.0* 3.1*   CBC:  Recent Labs Lab 06/15/14 1839  06/17/14 0930  06/18/14 0655 06/18/14 1459 06/19/14 0543 06/19/14 1110 06/20/14 1000 06/21/14 0500  WBC 8.1  < > 15.0*  --  18.2*  --  18.4*  --  11.7* 13.2*  NEUTROABS 4.7  --  11.8*  --   --   --   --   --  7.7  --   HGB 10.9*  < > 8.6*  < > 8.1* 8.8* 7.8* 7.8* 7.5* 7.3*  HCT 32.5*  < > 26.3*  < > 24.9* 26.0* 24.2* 23.0* 23.6* 22.9*  MCV 79.7  < > 79.2  --  79.8  --  82.6  --  82.5 83.0  PLT 9*  < > 24*  --  47*  --  100*  --  160 205  < > = values in this interval not displayed.  Cardiac Enzymes:  Recent Labs Lab  06/15/14 1839 06/16/14 2210 06/17/14 0500  CKTOTAL 5214* 1439* 915*   CBG:  Recent Labs Lab 06/16/14 0200 06/16/14 0823  GLUCAP 112* 108*   Recent Results (from the past 240 hour(s))  MRSA PCR Screening     Status: None   Collection Time: 06/16/14  2:10 AM  Result Value Ref Range Status   MRSA by PCR NEGATIVE NEGATIVE Final    Comment:        The GeneXpert MRSA Assay (FDA approved for NASAL specimens only), is one component of a comprehensive MRSA colonization surveillance program. It is not intended to diagnose MRSA infection nor to guide or monitor treatment for MRSA infections.      Scheduled Meds: . sodium chloride   Intravenous Once  . pantoprazole  40 mg Oral Daily  . potassium chloride  40 mEq Oral BID  . senna-docusate  1 tablet Oral BID  . sodium chloride 0.9 % 25 mL with methylPREDNISolone sodium succinate (SOLU-MEDROL) 125 mg/2 mL 80 mg infusion   Intravenous Daily  . sucralfate  1 g Oral 4 times per day   Continuous Infusions: . sodium chloride 125 mL/hr (06/20/14 1305)   Time spent: 15 minutes  Marzetta Board, MD [facility] Pager [phone]. If 7 PM - 7 AM, please contact night-coverage at [email or web address], password [facility] 06/21/2014, 7:43 AM  LOS: 6 days

## 2014-06-21 NOTE — Plan of Care (Signed)
Problem: Phase III Progression Outcomes Goal: Activity at appropriate level-compared to baseline (UP IN CHAIR FOR HEMODIALYSIS)  Outcome: Completed/Met Date Met:  06/21/14  Problem: Discharge Progression Outcomes Goal: Pain controlled with appropriate interventions Outcome: Completed/Met Date Met:  06/21/14 Goal: Tolerating diet Outcome: Completed/Met Date Met:  06/21/14

## 2014-06-21 NOTE — Plan of Care (Signed)
Problem: Phase III Progression Outcomes Goal: Activity at appropriate level-compared to baseline (UP IN CHAIR FOR HEMODIALYSIS)  Outcome: Progressing Goal: Discharge plan remains appropriate-arrangements made Outcome: Progressing

## 2014-06-21 NOTE — Progress Notes (Signed)
Patient's peripheral IV out of date. No complications noted at site, flushes well. Blood return noted. Pt with poor vein access. 2 RNs assessed, IV team consult placed to gain access.

## 2014-06-22 DIAGNOSIS — M791 Myalgia: Secondary | ICD-10-CM

## 2014-06-22 LAB — COMPREHENSIVE METABOLIC PANEL
ALBUMIN: 2.8 g/dL — AB (ref 3.5–5.2)
ALT: 24 U/L (ref 0–53)
ANION GAP: 9 (ref 5–15)
AST: 18 U/L (ref 0–37)
Alkaline Phosphatase: 45 U/L (ref 39–117)
BUN: 29 mg/dL — AB (ref 6–23)
CO2: 30 mEq/L (ref 19–32)
CREATININE: 1.75 mg/dL — AB (ref 0.50–1.35)
Calcium: 8.6 mg/dL (ref 8.4–10.5)
Chloride: 106 mEq/L (ref 96–112)
GFR calc Af Amer: 54 mL/min — ABNORMAL LOW (ref >90)
GFR calc non Af Amer: 47 mL/min — ABNORMAL LOW (ref >90)
Glucose, Bld: 88 mg/dL (ref 70–99)
Potassium: 3.9 mEq/L (ref 3.7–5.3)
Sodium: 145 mEq/L (ref 137–147)
TOTAL PROTEIN: 5.1 g/dL — AB (ref 6.0–8.3)
Total Bilirubin: 0.3 mg/dL (ref 0.3–1.2)

## 2014-06-22 LAB — TYPE AND SCREEN
ABO/RH(D): A POS
ANTIBODY SCREEN: NEGATIVE
Unit division: 0
Unit division: 0

## 2014-06-22 LAB — CBC WITH DIFFERENTIAL/PLATELET
Basophils Absolute: 0 10*3/uL (ref 0.0–0.1)
Basophils Relative: 0 % (ref 0–1)
Eosinophils Absolute: 0.1 10*3/uL (ref 0.0–0.7)
Eosinophils Relative: 0 % (ref 0–5)
HEMATOCRIT: 27.4 % — AB (ref 39.0–52.0)
HEMOGLOBIN: 8.7 g/dL — AB (ref 13.0–17.0)
LYMPHS ABS: 3.2 10*3/uL (ref 0.7–4.0)
Lymphocytes Relative: 23 % (ref 12–46)
MCH: 26.8 pg (ref 26.0–34.0)
MCHC: 31.8 g/dL (ref 30.0–36.0)
MCV: 84.3 fL (ref 78.0–100.0)
MONO ABS: 1.4 10*3/uL — AB (ref 0.1–1.0)
MONOS PCT: 10 % (ref 3–12)
NEUTROS ABS: 9.4 10*3/uL — AB (ref 1.7–7.7)
Neutrophils Relative %: 67 % (ref 43–77)
Platelets: 250 10*3/uL (ref 150–400)
RBC: 3.25 MIL/uL — AB (ref 4.22–5.81)
RDW: 16 % — ABNORMAL HIGH (ref 11.5–15.5)
WBC: 14.1 10*3/uL — AB (ref 4.0–10.5)

## 2014-06-22 LAB — LACTATE DEHYDROGENASE: LDH: 250 U/L (ref 94–250)

## 2014-06-22 MED ORDER — PREDNISONE 50 MG PO TABS
80.0000 mg | ORAL_TABLET | Freq: Every day | ORAL | Status: DC
  Administered 2014-06-22 – 2014-06-23 (×2): 80 mg via ORAL
  Filled 2014-06-22 (×3): qty 1

## 2014-06-22 MED ORDER — FOLIC ACID 1 MG PO TABS
2.0000 mg | ORAL_TABLET | Freq: Every day | ORAL | Status: DC
  Administered 2014-06-22 – 2014-06-23 (×2): 2 mg via ORAL
  Filled 2014-06-22 (×2): qty 2

## 2014-06-22 NOTE — Progress Notes (Signed)
PROGRESS NOTE  Kelly Bright GYI:[id number] DOB: 02/24/1971 DOA: 06/15/2014 PCP: Philis Fendt, MD  HPI: 43 y.o. male previously healthy and he decided to take 2 tablets of an OTC Weight Loss Supplement called Thermogenic Shred and and he had a very hard exercise workout the day prior to admission.  Subjective/ 24 H Interval events - no complaints this morning  Assessment/Plan: Principal Problem:   Thrombocytopenia Active Problems:   Rhabdomyolysis   AKI (acute kidney injury)   T.T.P. syndrome   Microcytic anemia    TTP - patient with severe thrombocytopenia, schistocytes on smear, elevated LDH, bilirubin and D dimer - hematology consulted, appreciate input - s/p daily PLEX, last on on 12/4 - ADAMTS13 level very low  - continue steroids, change to prednisone today  - platelets improving, now normal  Anemia  - in the setting of #1, s/p 2U pRBC on 12/5  Rhabdomyolysis - resolved  AKI - due to #1/2, stable, wonder if he will have a new baseline   Left arm swelling - no weakness noted, his strength is good, has a hard time closing the fist due to swelling - DVT US negative - improving off of IVF   Diet: regular Fluids: none DVT Prophylaxis: SCDs   Code Status: Full Family Communication: d/w patient Disposition Plan: home when ready   Consultants:  Hematology   Procedures:  PLEX   Antibiotics  Anti-infectives    None      Studies DG chest 11/30 1. Right IJ line noted ending about the proximal SVC. 2. No acute cardiopulmonary process seen.  Objective  Filed Vitals:   06/21/14 2018 06/21/14 2039 06/21/14 2326 06/22/14 0549  BP: 148/76 148/83 152/82 134/78  Pulse: 83 71 65 60  Temp: 98.5 F (36.9 C) 98.4 F (36.9 C) 98.5 F (36.9 C) 98.2 F (36.8 C)  TempSrc: Oral Oral Tympanic Oral  Resp: 17 17 18 18   Height:      Weight:      SpO2: 100% 100% 100% 98%    Intake/Output Summary (Last 24 hours) at 06/22/14 0819 Last data filed at  06/21/14 2326  Gross per 24 hour  Intake   1044 ml  Output      0 ml  Net   1044 ml   Filed Weights   06/18/14 0500 06/20/14 0700 06/20/14 1835  Weight: 95 kg (209 lb 7 oz) 122.3 kg (269 lb 10 oz) 122.3 kg (269 lb 10 oz)   Exam:  General:  NAD  Cardiovascular: RRR  Respiratory: CTA biL  Abdomen: soft, non tender  MSK: 2+ upper extremity edema  Neuro: non focal, strength 5/5 in all 4  Data Reviewed: Basic Metabolic Panel:  Recent Labs Lab 06/16/14 2210 06/17/14 0500 06/17/14 0930  06/18/14 0655 06/18/14 1459 06/19/14 0543 06/19/14 1110 06/21/14 0500 06/22/14 0537  NA 142 142 144  < > 145 143 148* 146 146 145  K 3.9 4.3 4.3  < > 4.3 3.8 4.1 3.3* 3.9 3.9  CL 102 104 105  < > 105 102 107 101 103 106  CO2 26 27 28   --  25  --  30  --  32 30  GLUCOSE 163* 119* 117*  < > 100* 133* 85 94 108* 88  BUN 39* 39* 37*  < > 34* 32* 30* 29* 28* 29*  CREATININE 2.28* 2.20* 2.11*  < > 1.92* 1.90* 2.00* 2.10* 1.70* 1.75*  CALCIUM 10.1 9.2 9.3  --  9.2  --  8.7  --  8.8 8.6  MG  --   --   --   --  1.7  --   --   --   --   --   PHOS 1.9* 3.2  --   --  4.1  --   --   --   --   --   < > = values in this interval not displayed. Liver Function Tests:  Recent Labs Lab 06/17/14 0930 06/19/14 0543 06/20/14 1000 06/21/14 0500 06/22/14 0537  AST 38* 24 20 18 18   ALT 30 24 21 20 24   ALKPHOS 41 51 47 51 45  BILITOT 0.8 0.3 0.3 0.2* 0.3  PROT 6.0 5.3* 5.6* 5.4* 5.1*  ALBUMIN 3.3* 3.0* 3.1* 2.9* 2.8*   CBC:  Recent Labs Lab 06/15/14 1839  06/17/14 0930  06/18/14 0655  06/19/14 0543 06/19/14 1110 06/20/14 1000 06/21/14 0500 06/22/14 0537  WBC 8.1  < > 15.0*  --  18.2*  --  18.4*  --  11.7* 13.2* 14.1*  NEUTROABS 4.7  --  11.8*  --   --   --   --   --  7.7  --  9.4*  HGB 10.9*  < > 8.6*  < > 8.1*  < > 7.8* 7.8* 7.5* 7.3* 8.7*  HCT 32.5*  < > 26.3*  < > 24.9*  < > 24.2* 23.0* 23.6* 22.9* 27.4*  MCV 79.7  < > 79.2  --  79.8  --  82.6  --  82.5 83.0 84.3  PLT 9*  < > 24*   --  47*  --  100*  --  160 205 250  < > = values in this interval not displayed.  Cardiac Enzymes:  Recent Labs Lab 06/15/14 1839 06/16/14 2210 06/17/14 0500  CKTOTAL 5214* 1439* 915*   CBG:  Recent Labs Lab 06/16/14 0200 06/16/14 0823  GLUCAP 112* 108*   Recent Results (from the past 240 hour(s))  MRSA PCR Screening     Status: None   Collection Time: 06/16/14  2:10 AM  Result Value Ref Range Status   MRSA by PCR NEGATIVE NEGATIVE Final    Comment:        The GeneXpert MRSA Assay (FDA approved for NASAL specimens only), is one component of a comprehensive MRSA colonization surveillance program. It is not intended to diagnose MRSA infection nor to guide or monitor treatment for MRSA infections.      Scheduled Meds: . folic acid  2 mg Oral Daily  . pantoprazole  40 mg Oral Daily  . potassium chloride  40 mEq Oral BID  . predniSONE  80 mg Oral Q breakfast  . senna-docusate  1 tablet Oral BID  . sodium chloride 0.9 % 25 mL with methylPREDNISolone sodium succinate (SOLU-MEDROL) 125 mg/2 mL 80 mg infusion   Intravenous Daily  . sucralfate  1 g Oral 4 times per day   Continuous Infusions: . sodium chloride 10 mL/hr at 06/21/14 1440   Time spent: 15 minutes  Marzetta Board, MD [facility] Pager [phone]. If 7 PM - 7 AM, please contact night-coverage at [email or web address], password [facility] 06/22/2014, 8:19 AM  LOS: 7 days

## 2014-06-22 NOTE — Progress Notes (Signed)
1 unit of blood transfused. Patient without complaints or distress. VSS, approximately 360 mL transfused. Will continue to monitor.

## 2014-06-22 NOTE — Progress Notes (Signed)
Kelly Bright is feeing good.  His platelet count is up to 250,000. He will not need any plasma exchange today.  He's had no fever. He's had no nausea or vomiting. He has had a little bit of edema. He probably could use a little bit of Lasix. He has about 5 L in since admission.  His LDH has come down. Today, the LDH was 250. His renal functions of the BUN of 29 and creatinine 1.75.  He did get 2 units of blood yesterday. His hemoglobin is 8.7. I will see what his reticulocyte count is.  We'll change his Solu-Medrol over to oral prednisone.  His vital signs were all stable. His blood pressure 1 3478. Temperature 98.2. Pulse is 60. Oral exam shows no mucositis. There is no hematomas. His lungs are clear. Cardiac exam regular rate and rhythm. There are no murmurs, rubs or bruits. Abdomen is soft. He has good bowel sounds. There is no palpable liver or spleen tip. Externally shows some 1+ edema.  Again, his platelet count is doing quite well. We will hold on plasma exchange today.  I will make sure that he is on folic acid. I think this will be helpful for him.  I will like to think that if all continues to stabilize, that he will be able to go home soon and do plasma exchange as an outpatient if necessary.  [name]  2 Timothy 4:16-18

## 2014-06-22 NOTE — Plan of Care (Signed)
Problem: Phase II Progression Outcomes Goal: IV changed to normal saline lock Outcome: Completed/Met Date Met:  06/22/14  Problem: Discharge Progression Outcomes Goal: Hemodynamically stable Outcome: Completed/Met Date Met:  06/22/14 Goal: Other Discharge Outcomes/Goals Outcome: Not Applicable Date Met:  84/78/41

## 2014-06-22 NOTE — Progress Notes (Signed)
Notified on-call provider that patient's telemetry has shown possible VTach and Aflutter. Patient is without complaints or distress at this time. EKG ordered. Will continue to monitor.

## 2014-06-23 LAB — CBC WITH DIFFERENTIAL/PLATELET
BASOS PCT: 0 % (ref 0–1)
Basophils Absolute: 0 10*3/uL (ref 0.0–0.1)
EOS ABS: 0.1 10*3/uL (ref 0.0–0.7)
Eosinophils Relative: 1 % (ref 0–5)
HCT: 29.1 % — ABNORMAL LOW (ref 39.0–52.0)
Hemoglobin: 9.4 g/dL — ABNORMAL LOW (ref 13.0–17.0)
Lymphocytes Relative: 24 % (ref 12–46)
Lymphs Abs: 3.2 10*3/uL (ref 0.7–4.0)
MCH: 27.8 pg (ref 26.0–34.0)
MCHC: 32.3 g/dL (ref 30.0–36.0)
MCV: 86.1 fL (ref 78.0–100.0)
MONO ABS: 1 10*3/uL (ref 0.1–1.0)
Monocytes Relative: 8 % (ref 3–12)
NEUTROS PCT: 67 % (ref 43–77)
Neutro Abs: 9.1 10*3/uL — ABNORMAL HIGH (ref 1.7–7.7)
Platelets: 300 10*3/uL (ref 150–400)
RBC: 3.38 MIL/uL — ABNORMAL LOW (ref 4.22–5.81)
RDW: 16.4 % — ABNORMAL HIGH (ref 11.5–15.5)
WBC: 13.4 10*3/uL — ABNORMAL HIGH (ref 4.0–10.5)

## 2014-06-23 LAB — COMPREHENSIVE METABOLIC PANEL
ALK PHOS: 42 U/L (ref 39–117)
ALT: 23 U/L (ref 0–53)
AST: 14 U/L (ref 0–37)
Albumin: 2.8 g/dL — ABNORMAL LOW (ref 3.5–5.2)
Anion gap: 10 (ref 5–15)
BUN: 26 mg/dL — ABNORMAL HIGH (ref 6–23)
CALCIUM: 8.5 mg/dL (ref 8.4–10.5)
CO2: 31 meq/L (ref 19–32)
Chloride: 105 mEq/L (ref 96–112)
Creatinine, Ser: 1.69 mg/dL — ABNORMAL HIGH (ref 0.50–1.35)
GFR calc non Af Amer: 49 mL/min — ABNORMAL LOW (ref >90)
GFR, EST AFRICAN AMERICAN: 56 mL/min — AB (ref >90)
GLUCOSE: 90 mg/dL (ref 70–99)
POTASSIUM: 3.9 meq/L (ref 3.7–5.3)
SODIUM: 146 meq/L (ref 137–147)
TOTAL PROTEIN: 5.2 g/dL — AB (ref 6.0–8.3)
Total Bilirubin: 0.3 mg/dL (ref 0.3–1.2)

## 2014-06-23 LAB — MPO/PR-3 (ANCA) ANTIBODIES

## 2014-06-23 LAB — ANCA SCREEN W REFLEX TITER
Atypical p-ANCA Screen: NEGATIVE
c-ANCA Screen: NEGATIVE
p-ANCA Screen: NEGATIVE

## 2014-06-23 LAB — LACTATE DEHYDROGENASE: LDH: 251 U/L — ABNORMAL HIGH (ref 94–250)

## 2014-06-23 LAB — ANA: Anti Nuclear Antibody(ANA): NEGATIVE

## 2014-06-23 MED ORDER — FOLIC ACID 1 MG PO TABS: 2.0000 mg | ORAL_TABLET | Freq: Every day | ORAL | Status: DC

## 2014-06-23 MED ORDER — PREDNISONE 20 MG PO TABS: 80.0000 mg | ORAL_TABLET | Freq: Every day | ORAL | Status: DC

## 2014-06-23 NOTE — Care Management Note (Signed)
    Page 1 of 1   06/23/2014     8:08:48 PM CARE MANAGEMENT NOTE 06/23/2014  Patient:  Kelly Bright, Kelly Bright   Account Number:  [id number]  Date Initiated:  06/16/2014  Documentation initiated by:  [facility]  Subjective/Objective Assessment:   Admitted with rhabdo, AKI, and low platelets.     Action/Plan:   Anticipated DC Date:  06/23/2014   Anticipated DC Plan:  [place]  CM consult      Choice offered to / List presented to:             Status of service:  Completed, signed off Medicare Important Message given?  NO (If response is "NO", the following Medicare IM given date fields will be blank) Date Medicare IM given:   Medicare IM given by:   Date Additional Medicare IM given:   Additional Medicare IM given by:    Discharge Disposition:  HOME/SELF CARE  Per UR Regulation:  Reviewed for med. necessity/level of care/duration of stay  If discussed at [place] of Stay Meetings, dates discussed:    Comments:  ContactBurman Blacksmith  [phone]      Mason,Melvin    [phone]  06/23/14 Tomi Bamberger RN, BSN [phone] no needs anticipated.

## 2014-06-23 NOTE — Plan of Care (Signed)
Problem: Phase III Progression Outcomes Goal: IV/normal saline lock discontinued Outcome: Not Applicable Date Met:  16/10/96 Goal: Discharge plan remains appropriate-arrangements made Outcome: Completed/Met Date Met:  06/23/14  Problem: Discharge Progression Outcomes Goal: Discharge plan in place and appropriate Outcome: Completed/Met Date Met:  04/54/09 Goal: Complications resolved/controlled Outcome: Not Applicable Date Met:  81/19/14 Goal: Activity appropriate for discharge plan Outcome: Not Applicable Date Met:  78/29/56

## 2014-06-23 NOTE — Discharge Instructions (Signed)
You were cared for by Bright hospitalist during your hospital stay. If you have any questions about your discharge medications or the care you received while you were in the hospital after you are discharged, you can call the unit and asked to speak with the hospitalist on call if the hospitalist that took care of you is not available. Once you are discharged, your primary care physician will handle any further medical issues. Please note that NO REFILLS for any discharge medications will be authorized once you are discharged, as it is imperative that you return to your primary care physician (or establish Bright relationship with Bright primary care physician if you do not have one) for your aftercare needs so that they can reassess your need for medications and monitor your lab values.  You can reach the hospitalist office at phone [phone] or fax [phone]   If you do not have Bright primary care physician, you can call [phone] for Bright physician referral.  Follow with Primary Kelly Bright Kelly Ebbs Bright, Kelly Bright in 2-4 weeks Follow up with Kelly Bright in 4 days  Get CBC, CMP checked by your doctor and again as further instructed.  Get Bright 2 view Chest X ray done next visit if you had Pneumonia of Lung problems at the [place] reviewed and adjusted.  Please request your Prim.Kelly Bright to go over all Hospital Tests and Procedure/Radiological results at the follow up, please get all Hospital records sent to your Prim Kelly Bright by signing hospital release before you go home.  Activity: As tolerated with Full fall precautions use walker/cane & assistance as needed  Diet: regular   For Heart failure patients - Check your Weight same time everyday, if you gain over 2 pounds, or you develop in leg swelling, experience more shortness of breath or chest pain, call your Primary Kelly Bright immediately. Follow Cardiac Low Salt Diet and 1.8 lit/day fluid restriction.  Disposition Home   If you experience worsening of your admission symptoms,  develop shortness of breath, life threatening emergency, suicidal or homicidal thoughts you must seek medical attention immediately by calling 911 or calling your Kelly Bright immediately  if symptoms less severe.  You Must read complete instructions/literature along with all the possible adverse reactions/side effects for all the Medicines you take and that have been prescribed to you. Take any new Medicines after you have completely understood and accpet all the possible adverse reactions/side effects.   Do not drive and provide baby sitting services if your were admitted for syncope or siezures until you have seen by Primary Kelly Bright or Bright Neurologist and advised to do so again.  Do not drive when taking Pain medications.   Do not take more than prescribed Pain, Sleep and Anxiety Medications  Special Instructions: If you have smoked or chewed Tobacco  in the last 2 yrs please stop smoking, stop any regular Alcohol  and or any Recreational drug use.  Wear Seat belts while driving.

## 2014-06-23 NOTE — Progress Notes (Signed)
Patient was discharged home by MD order; discharged instructions  review and give to patient with care notes and prescriptions; PICC line DIC; skin intact; patient will be escorted to the car by nurse tech via wheelchair.

## 2014-06-24 NOTE — Discharge Summary (Signed)
Physician Discharge Summary  Kelly Bright YPP:[id number] DOB: 1970-12-13 DOA: 06/15/2014  PCP: Philis Fendt, MD  Admit date: 06/15/2014 Discharge date: 06/23/2014  Time spent: 45 minutes  Recommendations for Outpatient Follow-up:  1. Follow up with Dr. Burr Medico in 4 days   Recommendations for follow up:  Repeat CBC, BMP  Discharge Diagnoses:  Principal Problem:   Thrombocytopenia Active Problems:   Rhabdomyolysis   AKI (acute kidney injury)   T.T.P. syndrome   Microcytic anemia  Discharge Condition: stable  Diet recommendation: regular   Filed Weights   06/18/14 0500 06/20/14 0700 06/20/14 1835  Weight: 95 kg (209 lb 7 oz) 122.3 kg (269 lb 10 oz) 122.3 kg (269 lb 10 oz)    History of present illness:  Kelly Bright is a 43 y.o. male previously healthy and he decided to take 2 tablets of an OTC Weight Loss Supplement called Thermogenic Shred and and he had a very hard exercise workout yesterday. Today, he presented to the ED with complaints of diffuse muscle pain, nausea and vomiting and dark urine and was found to have rhabdomyolysis with a total CK of 5214, as well as an elevated BUN/Cr of 49/2.68, and incidentally was found to have a platelet count of 9. He reports that he began to have blood blisters in his mouth and gums today. He has noticed bruises on his arms.He denies any cough or SOB or chest Pain. He denies any melena or hematochezia. He denies taking increased NSAID, but reports that he had URI symptoms last week and took OTC Tylenol cold and AlkaSeltzer Plus during that time.   Hospital Course:  TTP - patient was admitted to the hospital with severe thrombocytopenia, evidence of schistocytes on the smear, elevated LDH, bilirubin and d-dimer, hematology was consulted and he felt like his presentation was most consistent with TTP. He emergently had a dialysis catheter placed and he was began on plasmapheresis as well as high-dose steroids. He was initially  admitted to step down, however after plasmapheresis was initiated and he clinically continued to improve, he was transferred to a regular floor. He has remained stable with continued clinical improvement, and he underwent daily plasmapheresis sessions with his last one on 12/4. His platelets improved from less than 10 on admission to 300,000 by the time he was discharged and his LDH normalized. Patient was discharged after 72 hour observation after plasmapheresis, with continued improvement and in no need for further exchange, platelets are stable at the time of discharge, his steroids have been converted to oral prednisone 80 mg a day. Hematology followed the patient while hospitalized and will see patient in 4 days for repeat blood work. His ADAMTS13 level was checked and it was very low, consistent with TTP. Anemia - in the setting of #1, s/p 2U pRBC on 12/5 with good improvement in his hemoglobin Rhabdomyolysis - resolved with hydration AKI - patient with acute kidney injury in the setting of TTP and rhabdomyolysis, his renal function continued to improve slowly, his creatinine on discharge is 1.7, stable over the last 3 days, suspect that this may be a new baseline for him, were recommended close outpatient monitoring.   Procedures:  Plasmapheresis   Consultations:  Hematology  Discharge Exam: Filed Vitals:   06/22/14 2114 06/22/14 2213 06/23/14 0515 06/23/14 1427  BP: 177/90 141/72 139/85 154/75  Pulse: 74  63 83  Temp: 99.1 F (37.3 C)  98 F (36.7 C) 98.4 F (36.9 C)  TempSrc: Oral  Oral  Oral  Resp: 18  15 20   Height:      Weight:      SpO2: 100%  100% 100%    General: NAD Cardiovascular: RRR Respiratory: CTA biL  Discharge Instructions     Medication List    STOP taking these medications        ANTIOXIDANTS PO      TAKE these medications        cholecalciferol 1000 UNITS tablet  Commonly known as:  VITAMIN D  Take 1,000 Units by mouth daily.     folic acid 1  MG tablet  Commonly known as:  FOLVITE  Take 2 tablets (2 mg total) by mouth daily.     glucosamine-chondroitin 500-400 MG tablet  Take 1 tablet by mouth 2 (two) times daily.     Iron Tabs  Take 1 tablet by mouth daily.     predniSONE 20 MG tablet  Commonly known as:  DELTASONE  Take 4 tablets (80 mg total) by mouth daily with breakfast.     Vitamin C 500 MG Chew  Chew 1 tablet by mouth daily.           Follow-up Information    Follow up with Truitt Merle, MD In 4 days.   Specialties:  Hematology, Oncology   Contact information:   [place] [address] [phone]       The results of significant diagnostics from this hospitalization (including imaging, microbiology, ancillary and laboratory) are listed below for reference.    Significant Diagnostic Studies: Dg Chest Port 1 View  06/16/2014   CLINICAL DATA:  Status post hemodialysis catheter insertion.  EXAM: PORTABLE CHEST - 1 VIEW  COMPARISON:  None.  FINDINGS: The patient's right IJ line is seen ending about the proximal SVC.  The lungs are well-aerated and clear. There is no evidence of focal opacification, pleural effusion or pneumothorax.  The cardiomediastinal silhouette is within normal limits. No acute osseous abnormalities are seen.  IMPRESSION: 1. Right IJ line noted ending about the proximal SVC. 2. No acute cardiopulmonary process seen.   Electronically Signed   By: Garald Balding M.D.   On: 06/16/2014 03:04    Microbiology: Recent Results (from the past 240 hour(s))  MRSA PCR Screening     Status: None   Collection Time: 06/16/14  2:10 AM  Result Value Ref Range Status   MRSA by PCR NEGATIVE NEGATIVE Final    Comment:        The GeneXpert MRSA Assay (FDA approved for NASAL specimens only), is one component of a comprehensive MRSA colonization surveillance program. It is not intended to diagnose MRSA infection nor to guide or monitor treatment for MRSA infections.      Labs: Basic  Metabolic Panel:  Recent Labs Lab 06/18/14 0655  06/19/14 0543 06/19/14 1110 06/21/14 0500 06/22/14 0537 06/23/14 0540  NA 145  < > 148* 146 146 145 146  K 4.3  < > 4.1 3.3* 3.9 3.9 3.9  CL 105  < > 107 101 103 106 105  CO2 25  --  30  --  32 30 31  GLUCOSE 100*  < > 85 94 108* 88 90  BUN 34*  < > 30* 29* 28* 29* 26*  CREATININE 1.92*  < > 2.00* 2.10* 1.70* 1.75* 1.69*  CALCIUM 9.2  --  8.7  --  8.8 8.6 8.5  MG 1.7  --   --   --   --   --   --  PHOS 4.1  --   --   --   --   --   --   < > = values in this interval not displayed. Liver Function Tests:  Recent Labs Lab 06/19/14 0543 06/20/14 1000 06/21/14 0500 06/22/14 0537 06/23/14 0540  AST 24 20 18 18 14   ALT 24 21 20 24 23   ALKPHOS 51 47 51 45 42  BILITOT 0.3 0.3 0.2* 0.3 0.3  PROT 5.3* 5.6* 5.4* 5.1* 5.2*  ALBUMIN 3.0* 3.1* 2.9* 2.8* 2.8*   CBC:  Recent Labs Lab 06/19/14 0543 06/19/14 1110 06/20/14 1000 06/21/14 0500 06/22/14 0537 06/23/14 0540  WBC 18.4*  --  11.7* 13.2* 14.1* 13.4*  NEUTROABS  --   --  7.7  --  9.4* 9.1*  HGB 7.8* 7.8* 7.5* 7.3* 8.7* 9.4*  HCT 24.2* 23.0* 23.6* 22.9* 27.4* 29.1*  MCV 82.6  --  82.5 83.0 84.3 86.1  PLT 100*  --  160 205 250 300    Signed:  GHERGHE, COSTIN  [facility] 06/24/2014, 12:48 PM

## 2014-06-25 ENCOUNTER — Other Ambulatory Visit (HOSPITAL_BASED_OUTPATIENT_CLINIC_OR_DEPARTMENT_OTHER): Payer: 59

## 2014-06-25 ENCOUNTER — Telehealth: Payer: Self-pay | Admitting: Hematology

## 2014-06-25 ENCOUNTER — Other Ambulatory Visit: Payer: Self-pay | Admitting: Hematology

## 2014-06-25 DIAGNOSIS — M3119 Other thrombotic microangiopathy: Secondary | ICD-10-CM

## 2014-06-25 DIAGNOSIS — D696 Thrombocytopenia, unspecified: Secondary | ICD-10-CM

## 2014-06-25 DIAGNOSIS — M311 Thrombotic microangiopathy: Secondary | ICD-10-CM

## 2014-06-25 LAB — CBC & DIFF AND RETIC
BASO%: 0.1 % (ref 0.0–2.0)
BASOS ABS: 0 10*3/uL (ref 0.0–0.1)
EOS ABS: 0.1 10*3/uL (ref 0.0–0.5)
EOS%: 0.5 % (ref 0.0–7.0)
HCT: 33 % — ABNORMAL LOW (ref 38.4–49.9)
HEMOGLOBIN: 10.8 g/dL — AB (ref 13.0–17.1)
IMMATURE RETIC FRACT: 9.8 % (ref 3.00–10.60)
LYMPH#: 2.4 10*3/uL (ref 0.9–3.3)
LYMPH%: 15.8 % (ref 14.0–49.0)
MCH: 28.1 pg (ref 27.2–33.4)
MCHC: 32.7 g/dL (ref 32.0–36.0)
MCV: 85.7 fL (ref 79.3–98.0)
MONO#: 0.9 10*3/uL (ref 0.1–0.9)
MONO%: 5.8 % (ref 0.0–14.0)
NEUT%: 77.8 % — ABNORMAL HIGH (ref 39.0–75.0)
NEUTROS ABS: 12 10*3/uL — AB (ref 1.5–6.5)
Platelets: 348 10*3/uL (ref 140–400)
RBC: 3.85 10*6/uL — ABNORMAL LOW (ref 4.20–5.82)
RDW: 16.6 % — ABNORMAL HIGH (ref 11.0–14.6)
Retic %: 5.65 % — ABNORMAL HIGH (ref 0.80–1.80)
Retic Ct Abs: 217.53 10*3/uL — ABNORMAL HIGH (ref 34.80–93.90)
WBC: 15.4 10*3/uL — ABNORMAL HIGH (ref 4.0–10.3)

## 2014-06-25 LAB — LACTATE DEHYDROGENASE (CC13): LDH: 285 U/L — ABNORMAL HIGH (ref 125–245)

## 2014-06-25 NOTE — Telephone Encounter (Signed)
S/W PATIENT AND GAVE HOSP F/U APPT FOR 12/11 @ 12 W/DR. FENG, LAB 12/09 @ 12.

## 2014-06-25 NOTE — Telephone Encounter (Signed)
CALLED PATIENT TO SCHEDULE LAB APPT, PER PATIENT WILL CALL BACK DUE TO TRANSPORTATION ISSUES

## 2014-06-27 ENCOUNTER — Ambulatory Visit (HOSPITAL_BASED_OUTPATIENT_CLINIC_OR_DEPARTMENT_OTHER): Payer: 59

## 2014-06-27 ENCOUNTER — Encounter: Payer: Self-pay | Admitting: Hematology

## 2014-06-27 ENCOUNTER — Telehealth: Payer: Self-pay | Admitting: Hematology

## 2014-06-27 ENCOUNTER — Ambulatory Visit (HOSPITAL_BASED_OUTPATIENT_CLINIC_OR_DEPARTMENT_OTHER): Payer: 59 | Admitting: Hematology

## 2014-06-27 ENCOUNTER — Ambulatory Visit: Payer: 59

## 2014-06-27 ENCOUNTER — Encounter (INDEPENDENT_AMBULATORY_CARE_PROVIDER_SITE_OTHER): Payer: Self-pay

## 2014-06-27 VITALS — BP 150/81 | HR 82 | Temp 98.5°F | Resp 19 | Ht 76.0 in | Wt 264.6 lb

## 2014-06-27 DIAGNOSIS — N289 Disorder of kidney and ureter, unspecified: Secondary | ICD-10-CM

## 2014-06-27 DIAGNOSIS — M311 Thrombotic microangiopathy: Secondary | ICD-10-CM

## 2014-06-27 DIAGNOSIS — R531 Weakness: Secondary | ICD-10-CM

## 2014-06-27 DIAGNOSIS — M3119 Other thrombotic microangiopathy: Secondary | ICD-10-CM

## 2014-06-27 LAB — CBC & DIFF AND RETIC
BASO%: 0.1 % (ref 0.0–2.0)
Basophils Absolute: 0 10*3/uL (ref 0.0–0.1)
EOS ABS: 0 10*3/uL (ref 0.0–0.5)
EOS%: 0.1 % (ref 0.0–7.0)
HCT: 33.3 % — ABNORMAL LOW (ref 38.4–49.9)
HGB: 10.8 g/dL — ABNORMAL LOW (ref 13.0–17.1)
Immature Retic Fract: 6.1 % (ref 3.00–10.60)
LYMPH%: 13.5 % — AB (ref 14.0–49.0)
MCH: 27.8 pg (ref 27.2–33.4)
MCHC: 32.4 g/dL (ref 32.0–36.0)
MCV: 85.6 fL (ref 79.3–98.0)
MONO#: 1 10*3/uL — ABNORMAL HIGH (ref 0.1–0.9)
MONO%: 7.3 % (ref 0.0–14.0)
NEUT%: 79 % — ABNORMAL HIGH (ref 39.0–75.0)
NEUTROS ABS: 11.1 10*3/uL — AB (ref 1.5–6.5)
Platelets: 311 10*3/uL (ref 140–400)
RBC: 3.89 10*6/uL — ABNORMAL LOW (ref 4.20–5.82)
RDW: 16.8 % — AB (ref 11.0–14.6)
Retic %: 5.76 % — ABNORMAL HIGH (ref 0.80–1.80)
Retic Ct Abs: 224.06 10*3/uL — ABNORMAL HIGH (ref 34.80–93.90)
WBC: 14 10*3/uL — ABNORMAL HIGH (ref 4.0–10.3)
lymph#: 1.9 10*3/uL (ref 0.9–3.3)

## 2014-06-27 LAB — LACTATE DEHYDROGENASE (CC13): LDH: 285 U/L — ABNORMAL HIGH (ref 125–245)

## 2014-06-27 LAB — COMPREHENSIVE METABOLIC PANEL (CC13)
ALT: 32 U/L (ref 0–55)
AST: 19 U/L (ref 5–34)
Albumin: 3.7 g/dL (ref 3.5–5.0)
Alkaline Phosphatase: 43 U/L (ref 40–150)
Anion Gap: 11 mEq/L (ref 3–11)
BILIRUBIN TOTAL: 0.46 mg/dL (ref 0.20–1.20)
BUN: 21.7 mg/dL (ref 7.0–26.0)
CO2: 31 meq/L — AB (ref 22–29)
CREATININE: 1.4 mg/dL — AB (ref 0.7–1.3)
Calcium: 9.3 mg/dL (ref 8.4–10.4)
Chloride: 102 mEq/L (ref 98–109)
EGFR: 74 mL/min/{1.73_m2} — AB (ref >90)
Glucose: 83 mg/dl (ref 70–140)
Potassium: 3.6 mEq/L (ref 3.5–5.1)
Sodium: 143 mEq/L (ref 136–145)
TOTAL PROTEIN: 6.5 g/dL (ref 6.4–8.3)

## 2014-06-27 MED ORDER — PREDNISONE 5 MG PO TABS: 5.0000 mg | ORAL_TABLET | Freq: Every day | ORAL | Status: DC

## 2014-06-27 MED ORDER — ESOMEPRAZOLE MAGNESIUM 20 MG PO CPDR: 20.0000 mg | DELAYED_RELEASE_CAPSULE | Freq: Every day | ORAL | Status: DC

## 2014-06-27 NOTE — Progress Notes (Signed)
Checked in new patient with no issues at this time. He has appt card and [place].

## 2014-06-27 NOTE — Telephone Encounter (Signed)
Gave avs & cal for Dec/Jan.

## 2014-06-27 NOTE — Progress Notes (Signed)
[facility]  [phone] [phone]) [phone]  Clinic New Consult Note   Patient Care Team: Philis Fendt, MD as PCP - General (Internal Medicine) 06/27/2014  CHIEF COMPLAINTS/PURPOSE OF CONSULTATION TTP   HISTORY OF PRESENTING ILLNESS:  Kelly Bright 43 y.o. male  without a significant past medical history, who was recently diagnosed with TTP. I saw him when he was in the hospital, he is here for follow-up after hospital discharge.  He presented to the ED on 06/23/2014 with complaints of diffuse muscle pain, nausea and vomiting, blood blister in mouth,  and dark urine for one day. He was found to have rhabdomyolysis with a total CK of 5214, as well as an elevated BUN/Cr of 49/2.68, and incidentally was found to have a platelet count of 9. I was consulted when I was on call. His lab test results supports TTP, and I started him on plasma exchange the next day at [facility] He responded well and a platelet of recovered after 4 sessions of plasma exchange (a total of 5 sessions). I also started him on supplemental 125 mg IV every 12 hours, and gradually decreased to 80 mg prednisone once daily when he was discharged home on 06/21/2014. His dialysis catheter was removed upon discharge.  He is still recovering from this recent hospitalization. He denies any pain, cough, dyspnea, or GI symptoms. He has very good appetite and eats well. HE complains being moderately fatigued, able to walk independently, but is slightly lightheaded and his balance is slightly off. He denies any headaches, vision change, numbness or any focal weakness. No fever chills, confusion.   MEDICAL HISTORY:  None  SURGICAL HISTORY: Past Surgical History  Procedure Laterality Date  . Appendectomy      SOCIAL HISTORY: History   Social History  . Marital Status: Single    Spouse Name: N/A    Number of Children: N/A  . Years of Education: N/A   Occupational History  . Not on  file.   Social History Main Topics  . Smoking status: Never Smoker   . Smokeless tobacco: Not on file  . Alcohol Use: No  . Drug Use: No  . Sexual Activity: Not on file   Other Topics Concern  . Not on file   Social History Narrative    FAMILY HISTORY: No family history of blood disorder or cancer. Once his cousin has lupus.  ALLERGIES:  is allergic to novocain.  MEDICATIONS:  Current Outpatient Prescriptions  Medication Sig Dispense Refill  . Ascorbic Acid (VITAMIN C) 500 MG CHEW Chew 1 tablet by mouth daily.    . cholecalciferol (VITAMIN D) 1000 UNITS tablet Take 1,000 Units by mouth daily.    . folic acid (FOLVITE) 1 MG tablet Take 2 tablets (2 mg total) by mouth daily. 30 tablet 0  . glucosamine-chondroitin 500-400 MG tablet Take 1 tablet by mouth 2 (two) times daily.    . Iron TABS Take 1 tablet by mouth daily.    . predniSONE (DELTASONE) 20 MG tablet Take 4 tablets (80 mg total) by mouth daily with breakfast. 56 tablet 0  . esomeprazole (NEXIUM) 20 MG capsule Take 1 capsule (20 mg total) by mouth daily at 12 noon. 30 capsule 0  . predniSONE (DELTASONE) 5 MG tablet Take 1 tablet (5 mg total) by mouth daily with breakfast. 86 tablet 0   No current facility-administered medications for this visit.    REVIEW OF SYSTEMS:   Constitutional: Denies fevers, chills  or abnormal night sweats Eyes: Denies blurriness of vision, double vision or watery eyes Ears, nose, mouth, throat, and face: Denies mucositis or sore throat Respiratory: Denies cough, dyspnea or wheezes Cardiovascular: Denies palpitation, chest discomfort or lower extremity swelling Gastrointestinal:  Denies nausea, heartburn or change in bowel habits Skin: Denies abnormal skin rashes Lymphatics: Denies new lymphadenopathy or easy bruising Neurological:Denies numbness, tingling or new weaknesses Behavioral/Psych: Mood is stable, no new changes  All other systems were reviewed with the patient and are  negative.  PHYSICAL EXAMINATION: ECOG PERFORMANCE STATUS: 1 - Symptomatic but completely ambulatory  Filed Vitals:   06/27/14 1236  BP: 150/81  Pulse: 82  Temp: 98.5 F (36.9 C)  Resp: 19   Filed Weights   06/27/14 1236  Weight: 264 lb 9.6 oz (120.022 kg)    GENERAL:alert, no distress and comfortable SKIN: skin color, texture, turgor are normal, no rashes or significant lesions EYES: normal, conjunctiva are pink and non-injected, sclera clear OROPHARYNX:no exudate, no erythema and lips, buccal mucosa, and tongue normal  NECK: supple, thyroid normal size, non-tender, without nodularity LYMPH:  no palpable lymphadenopathy in the cervical, axillary or inguinal LUNGS: clear to auscultation and percussion with normal breathing effort HEART: regular rate & rhythm and no murmurs and no lower extremity edema ABDOMEN:abdomen soft, non-tender and normal bowel sounds Musculoskeletal:no cyanosis of digits and no clubbing  PSYCH: alert & oriented x 3 with fluent speech NEURO: no focal motor/sensory deficits  LABORATORY DATA:  I have reviewed the data as listed CBC Latest Ref Rng 06/27/2014 06/25/2014 06/23/2014  WBC 4.0 - 10.3 10e3/uL 14.0(H) 15.4(H) 13.4(H)  Hemoglobin 13.0 - 17.1 g/dL 10.8(L) 10.8(L) 9.4(L)  Hematocrit 38.4 - 49.9 % 33.3(L) 33.0(L) 29.1(L)  Platelets 140 - 400 10e3/uL 311 348 300    CMP Latest Ref Rng 06/27/2014 06/23/2014 06/22/2014  Glucose 70 - 140 mg/dl 83 90 88  BUN 7.0 - 26.0 mg/dL 21.7 26(H) 29(H)  Creatinine 0.7 - 1.3 mg/dL 1.4(H) 1.69(H) 1.75(H)  Sodium 136 - 145 mEq/L 143 146 145  Potassium 3.5 - 5.1 mEq/L 3.6 3.9 3.9  Chloride 96 - 112 mEq/L - 105 106  CO2 22 - 29 mEq/L 31(H) 31 30  Calcium 8.4 - 10.4 mg/dL 9.3 8.5 8.6  Total Protein 6.4 - 8.3 g/dL 6.5 5.2(L) 5.1(L)  Total Bilirubin 0.20 - 1.20 mg/dL 0.46 0.3 0.3  Alkaline Phos 40 - 150 U/L 43 42 45  AST 5 - 34 U/L 19 14 18   ALT 0 - 55 U/L 32 23 24   Results for TAMEEM, PULLARA (MRN [id number]) as of  06/27/2014 15:24  Ref. Range 06/20/2014 10:00 06/22/2014 05:37 06/23/2014 05:40 06/25/2014 13:03 06/27/2014 13:59  LDH Latest Range: 125-245 U/L 285 (H) 250 251 (H) 285 (H) 285 (H)   Results for ARSHAWN, VALDEZ (MRN [id number]) as of 06/27/2014 15:24  Ref. Range 06/16/2014 02:57  Adamts 13 Activity Latest Range: 68-163 % Activity <3 (L)    RADIOGRAPHIC STUDIES: I have personally reviewed the radiological images as listed and agreed with the findings in the report. Dg Chest Port 1 View  06/16/2014   CLINICAL DATA:  Status post hemodialysis catheter insertion.  EXAM: PORTABLE CHEST - 1 VIEW  COMPARISON:  None.  FINDINGS: The patient's right IJ line is seen ending about the proximal SVC.  The lungs are well-aerated and clear. There is no evidence of focal opacification, pleural effusion or pneumothorax.  The cardiomediastinal silhouette is within normal limits. No acute osseous abnormalities are  seen.  IMPRESSION: 1. Right IJ line noted ending about the proximal SVC. 2. No acute cardiopulmonary process seen.   Electronically Signed   By: Garald Balding M.D.   On: 06/16/2014 03:04    ASSESSMENT & PLAN:  43 year old African-American male, without significant past medical history, with recently diagnosed TTP.   1. Acquired TTP (thrombotic thrombocytopenic purpura) -Is Adams 13 activity was less than 3%, which supports the diagnosis of TTP. I'll obtain the Adams 13 inhibitor titer level to see if this is acquired. -We discussed this is likely autoimmune disease, it is possible he may have recurrent episodes in the future. If the second episodes happens, I would consider immunosuppressant therapy. -His plate count has recovered well and hold up well. I plan to gradually taper off his prednisone in the next 3 weeks. -I instructed him to reduce his prednisone from 80 mg to 40 mg once daily for the next week, then taper down every 4 days to 20 mg, 10 mg, 5 mg daily then 5 mg every other day for additional 2  doses, then off. A new prescription of prednisone 5 mg, a total of 86 tablets was called in today. -I also according Nexium for prophylaxis use when he is on prednisone.  2. Renal insufficiency -This is related to his recent episode of rhabdomyolysis and TTP. -His renal function is slowly recovering, although may not be able to recover completely -We discussed to avoid renal toxic medications, such as NSAIDs and CT IV contrast.  3. Generalized weakness and slightly balance issue -This could be related to his prolonged hospital stay and high dose of steroids. His neuro exam revealed no focal deficits, except mild balance issue. -I encouraged him to gradually start physical exercise, but avoid contact sports. -If his balance issue persists beyond 2 weeks, I'll obtain a brain MRI to further evaluate.  Plan #1 repeat a CBC in 2 weeks  #2 Adams 13 inhibitor titer #3 taper off prednisone in 3 weeks as instructed above. #4 return to clinic for follow-up and repeated lab in 3-4 weeks.   All questions were answered. The patient knows to call the clinic with any problems, questions or concerns. I spent 30 minutes counseling the patient face to face. The total time spent in the appointment was 40 minutes and more than 50% was on counseling.     Truitt Merle, MD 06/27/2014 3:05 PM

## 2014-07-01 ENCOUNTER — Telehealth: Payer: Self-pay | Admitting: *Deleted

## 2014-07-01 NOTE — Telephone Encounter (Signed)
Spoke with [name] and responded to a letter sent by her on pt's behalf for disability claim.  Faxed last office notes from 06/27/14 by Dr. Burr Medico to [facility].  [name] stated she would give office notes to HR for follow up. [name]   Phone    [phone]  ;    Fax    [phone].

## 2014-07-02 ENCOUNTER — Telehealth: Payer: Self-pay | Admitting: *Deleted

## 2014-07-02 NOTE — Telephone Encounter (Signed)
Received call from [name] requesting last office notes to be refaxed to her office on behalf of pt's disability claim.  Last office notes refaxed as requested. [name]     Phone     [phone]       Fax    [phone].

## 2014-07-09 ENCOUNTER — Ambulatory Visit (HOSPITAL_BASED_OUTPATIENT_CLINIC_OR_DEPARTMENT_OTHER): Payer: 59

## 2014-07-09 DIAGNOSIS — M311 Thrombotic microangiopathy: Secondary | ICD-10-CM

## 2014-07-09 DIAGNOSIS — N289 Disorder of kidney and ureter, unspecified: Secondary | ICD-10-CM

## 2014-07-09 DIAGNOSIS — R531 Weakness: Secondary | ICD-10-CM

## 2014-07-09 DIAGNOSIS — M3119 Other thrombotic microangiopathy: Secondary | ICD-10-CM

## 2014-07-09 LAB — COMPREHENSIVE METABOLIC PANEL (CC13)
ALT: 37 U/L (ref 0–55)
AST: 20 U/L (ref 5–34)
Albumin: 3.9 g/dL (ref 3.5–5.0)
Alkaline Phosphatase: 36 U/L — ABNORMAL LOW (ref 40–150)
Anion Gap: 12 mEq/L — ABNORMAL HIGH (ref 3–11)
BUN: 21.3 mg/dL (ref 7.0–26.0)
CALCIUM: 9.8 mg/dL (ref 8.4–10.4)
CHLORIDE: 101 meq/L (ref 98–109)
CO2: 29 mEq/L (ref 22–29)
Creatinine: 1.2 mg/dL (ref 0.7–1.3)
EGFR: 85 mL/min/{1.73_m2} — ABNORMAL LOW (ref >90)
GLUCOSE: 78 mg/dL (ref 70–140)
POTASSIUM: 3.8 meq/L (ref 3.5–5.1)
SODIUM: 142 meq/L (ref 136–145)
TOTAL PROTEIN: 7 g/dL (ref 6.4–8.3)
Total Bilirubin: 0.39 mg/dL (ref 0.20–1.20)

## 2014-07-09 LAB — CBC & DIFF AND RETIC
BASO%: 0.3 % (ref 0.0–2.0)
Basophils Absolute: 0 10*3/uL (ref 0.0–0.1)
EOS%: 1 % (ref 0.0–7.0)
Eosinophils Absolute: 0.1 10*3/uL (ref 0.0–0.5)
HCT: 37.6 % — ABNORMAL LOW (ref 38.4–49.9)
HEMOGLOBIN: 12 g/dL — AB (ref 13.0–17.1)
Immature Retic Fract: 4 % (ref 3.00–10.60)
LYMPH#: 1.8 10*3/uL (ref 0.9–3.3)
LYMPH%: 25.9 % (ref 14.0–49.0)
MCH: 28 pg (ref 27.2–33.4)
MCHC: 31.9 g/dL — AB (ref 32.0–36.0)
MCV: 87.9 fL (ref 79.3–98.0)
MONO#: 0.4 10*3/uL (ref 0.1–0.9)
MONO%: 5.8 % (ref 0.0–14.0)
NEUT#: 4.8 10*3/uL (ref 1.5–6.5)
NEUT%: 67 % (ref 39.0–75.0)
Platelets: 162 10*3/uL (ref 140–400)
RBC: 4.28 10*6/uL (ref 4.20–5.82)
RDW: 16.3 % — AB (ref 11.0–14.6)
RETIC %: 3.14 % — AB (ref 0.80–1.80)
Retic Ct Abs: 134.39 10*3/uL — ABNORMAL HIGH (ref 34.80–93.90)
WBC: 7.1 10*3/uL (ref 4.0–10.3)

## 2014-07-09 LAB — LACTATE DEHYDROGENASE (CC13): LDH: 216 U/L (ref 125–245)

## 2014-07-17 ENCOUNTER — Telehealth: Payer: Self-pay | Admitting: *Deleted

## 2014-07-17 NOTE — Telephone Encounter (Signed)
Received call from pt stating that disability papers have him going back to work on 07/23/14 & he doesn't see Dr Burr Medico until then.  Referred to [facility] & asked her to contact pt.

## 2014-07-21 ENCOUNTER — Encounter: Payer: Self-pay | Admitting: Hematology

## 2014-07-21 ENCOUNTER — Telehealth: Payer: Self-pay | Admitting: *Deleted

## 2014-07-21 NOTE — Telephone Encounter (Signed)
Received call from pt asking about how to taper his prednisone.  He states Dr Burr Medico wrote it down for him but he wasn't sure if he was doing it write.  He has been taking prednisone 20 mg since 12/21.  He was supposed to take 20 mg for 4 days then taper down .  Instructions given per Dr Ernestina Penna OV note to cont taper.  He is also concerned about his disability papers & needs to extend his return to work date to 1/18l/16.  Instructed him to talk with his HR dept & let them know he sees Dr Burr Medico on 07/23/13.

## 2014-07-21 NOTE — Progress Notes (Signed)
Mr. Birdwell states he needs a note extending his time out of work until 08/04/14, he was approved until 07/23/14 and that is the day he sees Dr Burr Medico, left message on nurse's vm.

## 2014-07-23 ENCOUNTER — Ambulatory Visit (HOSPITAL_BASED_OUTPATIENT_CLINIC_OR_DEPARTMENT_OTHER): Payer: 59 | Admitting: Hematology

## 2014-07-23 ENCOUNTER — Telehealth: Payer: Self-pay | Admitting: Hematology

## 2014-07-23 ENCOUNTER — Other Ambulatory Visit (HOSPITAL_BASED_OUTPATIENT_CLINIC_OR_DEPARTMENT_OTHER): Payer: 59

## 2014-07-23 ENCOUNTER — Encounter: Payer: Self-pay | Admitting: Hematology

## 2014-07-23 VITALS — BP 139/94 | HR 102 | Temp 98.0°F | Resp 18 | Ht 76.0 in | Wt 252.6 lb

## 2014-07-23 DIAGNOSIS — M3119 Other thrombotic microangiopathy: Secondary | ICD-10-CM

## 2014-07-23 DIAGNOSIS — M311 Thrombotic microangiopathy: Secondary | ICD-10-CM

## 2014-07-23 DIAGNOSIS — M6281 Muscle weakness (generalized): Secondary | ICD-10-CM

## 2014-07-23 LAB — LACTATE DEHYDROGENASE (CC13): LDH: 211 U/L (ref 125–245)

## 2014-07-23 LAB — COMPREHENSIVE METABOLIC PANEL (CC13)
ALBUMIN: 4.3 g/dL (ref 3.5–5.0)
ALK PHOS: 35 U/L — AB (ref 40–150)
ALT: 51 U/L (ref 0–55)
AST: 29 U/L (ref 5–34)
Anion Gap: 11 mEq/L (ref 3–11)
BUN: 18.1 mg/dL (ref 7.0–26.0)
CALCIUM: 10.2 mg/dL (ref 8.4–10.4)
CO2: 31 mEq/L — ABNORMAL HIGH (ref 22–29)
Chloride: 100 mEq/L (ref 98–109)
Creatinine: 1.2 mg/dL (ref 0.7–1.3)
EGFR: 90 mL/min/{1.73_m2} — ABNORMAL LOW (ref >90)
Glucose: 84 mg/dl (ref 70–140)
POTASSIUM: 4.1 meq/L (ref 3.5–5.1)
Sodium: 142 mEq/L (ref 136–145)
Total Bilirubin: 0.41 mg/dL (ref 0.20–1.20)
Total Protein: 7.6 g/dL (ref 6.4–8.3)

## 2014-07-23 LAB — CBC & DIFF AND RETIC
BASO%: 0.5 % (ref 0.0–2.0)
Basophils Absolute: 0 10*3/uL (ref 0.0–0.1)
EOS%: 1 % (ref 0.0–7.0)
Eosinophils Absolute: 0.1 10*3/uL (ref 0.0–0.5)
HCT: 42.8 % (ref 38.4–49.9)
HGB: 13.9 g/dL (ref 13.0–17.1)
Immature Retic Fract: 3.6 % (ref 3.00–10.60)
LYMPH#: 2.2 10*3/uL (ref 0.9–3.3)
LYMPH%: 35 % (ref 14.0–49.0)
MCH: 28.3 pg (ref 27.2–33.4)
MCHC: 32.5 g/dL (ref 32.0–36.0)
MCV: 87 fL (ref 79.3–98.0)
MONO#: 0.5 10*3/uL (ref 0.1–0.9)
MONO%: 7.6 % (ref 0.0–14.0)
NEUT#: 3.5 10*3/uL (ref 1.5–6.5)
NEUT%: 55.9 % (ref 39.0–75.0)
Platelets: 258 10*3/uL (ref 140–400)
RBC: 4.92 10*6/uL (ref 4.20–5.82)
RDW: 14.5 % (ref 11.0–14.6)
Retic %: 2.51 % — ABNORMAL HIGH (ref 0.80–1.80)
Retic Ct Abs: 123.49 10*3/uL — ABNORMAL HIGH (ref 34.80–93.90)
WBC: 6.2 10*3/uL (ref 4.0–10.3)

## 2014-07-23 MED ORDER — PREDNISONE 5 MG PO TABS: 5.0000 mg | ORAL_TABLET | Freq: Every day | ORAL | Status: DC

## 2014-07-23 NOTE — Progress Notes (Addendum)
[place]  Telephone:(336) [phone] Fax:(336) [phone]  Clinic New Consult Note   Patient Care Team: Philis Fendt, MD as PCP - General (Internal Medicine) 07/23/2014  CHIEF COMPLAINTS Follow up TTP   HISTORY OF INITIAL PRESENTING ILLNESS:  Kelly Bright 44 y.o. male  without a significant past medical history, who was recently diagnosed with TTP. I saw him when he was in the hospital, he is here for follow-up after hospital discharge.  He presented to the ED on 06/23/2014 with complaints of diffuse muscle pain, nausea and vomiting, blood blister in mouth,  and dark urine for one day. He was found to have rhabdomyolysis with a total CK of 5214, as well as an elevated BUN/Cr of 49/2.68, and incidentally was found to have a platelet count of 9. I was consulted when I was on call. His lab test results supports TTP, and I started him on plasma exchange the next day at [facility]. He responded well and a platelet of recovered after 4 sessions of plasma exchange (a total of 5 sessions). I also started him on supplemental 125 mg IV every 12 hours, and gradually decreased to 80 mg prednisone once daily when he was discharged home on 06/21/2014. His dialysis catheter was removed upon discharge.  INTERIM HISTORY: He returns for follow up. He overall feels better, with better energy and appetite, his gait is back to normal now. He has great appetite. Weight is stable. He does still have some symptoms, including intermittent headaches and pain in her back of eyes, no vision change or other neuro symptoms. He has mood swings and difficulty concentration and difficulty sleeping also. In the past few days, he had a few episodes of typical left chest pain at rib cage which lasts about a few seconds, and palpitation. It resolved on its own and he feels well today.  MEDICAL HISTORY:  None  SURGICAL HISTORYapp: Past Surgical History  Procedure Laterality Date  . Appendectomy       SOCIAL HISTORY: History   Social History  . Marital Status: Single    Spouse Name: N/A    Number of Children: N/A  . Years of Education: N/A   Occupational History  . Not on file.   Social History Main Topics  . Smoking status: Never Smoker   . Smokeless tobacco: Not on file  . Alcohol Use: No  . Drug Use: No  . Sexual Activity: Not on file   Other Topics Concern  . Not on file   Social History Narrative    FAMILY HISTORY: No family history of blood disorder or cancer. One of his cousins has lupus.  ALLERGIES:  is allergic to novocain.  MEDICATIONS:  Current Outpatient Prescriptions  Medication Sig Dispense Refill  . Ascorbic Acid (VITAMIN C) 500 MG CHEW Chew 1 tablet by mouth daily.    . cholecalciferol (VITAMIN D) 1000 UNITS tablet Take 1,000 Units by mouth daily.    Marland Kitchen esomeprazole (NEXIUM) 20 MG capsule Take 1 capsule (20 mg total) by mouth daily at 12 noon. 30 capsule 0  . glucosamine-chondroitin 500-400 MG tablet Take 1 tablet by mouth 2 (two) times daily.    . Iron TABS Take 1 tablet by mouth daily.    . folic acid (FOLVITE) 1 MG tablet Take 2 tablets (2 mg total) by mouth daily. (Patient not taking: Reported on 07/23/2014) 30 tablet 0  . predniSONE (DELTASONE) 20 MG tablet Take 4 tablets (80 mg total) by mouth  daily with breakfast. (Patient not taking: Reported on 07/23/2014) 56 tablet 0  . predniSONE (DELTASONE) 5 MG tablet Take 1 tablet (5 mg total) by mouth daily with breakfast. (Patient taking differently: Take 10 mg by mouth daily with breakfast. Tapering dose, to go to 5 mg next) 86 tablet 0   No current facility-administered medications for this visit.    REVIEW OF SYSTEMS:   Constitutional: Denies fevers, chills or abnormal night sweats Eyes: Denies blurriness of vision, double vision or watery eyes Ears, nose, mouth, throat, and face: Denies mucositis or sore throat Respiratory: Denies cough, dyspnea or wheezes Cardiovascular: Denies palpitation,  chest discomfort or lower extremity swelling Gastrointestinal:  Denies nausea, heartburn or change in bowel habits Skin: Denies abnormal skin rashes Lymphatics: Denies new lymphadenopathy or easy bruising Neurological:Denies numbness, tingling or new weaknesses Behavioral/Psych: Mood is stable, no new changes  All other systems were reviewed with the patient and are negative.  PHYSICAL EXAMINATION: ECOG PERFORMANCE STATUS: 0  Filed Vitals:   07/23/14 1350  BP: 139/94  Pulse: 102  Temp: 98 F (36.7 C)  Resp: 18   Filed Weights   07/23/14 1350  Weight: 252 lb 9.6 oz (114.579 kg)    GENERAL:alert, no distress and comfortable SKIN: skin color, texture, turgor are normal, no rashes or significant lesions EYES: normal, conjunctiva are pink and non-injected, sclera clear OROPHARYNX:no exudate, no erythema and lips, buccal mucosa, and tongue normal  NECK: supple, thyroid normal size, non-tender, without nodularity LYMPH:  no palpable lymphadenopathy in the cervical, axillary or inguinal LUNGS: clear to auscultation and percussion with normal breathing effort HEART: regular rate & rhythm and no murmurs and no lower extremity edema ABDOMEN:abdomen soft, non-tender and normal bowel sounds Musculoskeletal:no cyanosis of digits and no clubbing  PSYCH: alert & oriented x 3 with fluent speech NEURO: no focal motor/sensory deficits  LABORATORY DATA:  I have reviewed the data as listed CBC Latest Ref Rng 07/23/2014 07/09/2014 06/27/2014  WBC 4.0 - 10.3 10e3/uL 6.2 7.1 14.0(H)  Hemoglobin 13.0 - 17.1 g/dL 13.9 12.0(L) 10.8(L)  Hematocrit 38.4 - 49.9 % 42.8 37.6(L) 33.3(L)  Platelets 140 - 400 10e3/uL 258 162 311    CMP Latest Ref Rng 07/23/2014 07/09/2014 06/27/2014  Glucose 70 - 140 mg/dl 84 78 83  BUN 7.0 - 26.0 mg/dL 18.1 21.3 21.7  Creatinine 0.7 - 1.3 mg/dL 1.2 1.2 1.4(H)  Sodium 136 - 145 mEq/L 142 142 143  Potassium 3.5 - 5.1 mEq/L 4.1 3.8 3.6  Chloride 96 - 112 mEq/L - - -  CO2  22 - 29 mEq/L 31(H) 29 31(H)  Calcium 8.4 - 10.4 mg/dL 10.2 9.8 9.3  Total Protein 6.4 - 8.3 g/dL 7.6 7.0 6.5  Total Bilirubin 0.20 - 1.20 mg/dL 0.41 0.39 0.46  Alkaline Phos 40 - 150 U/L 35(L) 36(L) 43  AST 5 - 34 U/L 29 20 19   ALT 0 - 55 U/L 51 37 32   Lactate dehydrogenase  Status: Finalresult Visible to patient:  Not Released Nextappt: 10/24/2014 at 10:15 AM in Oncology [facility] Lab 1)           Ref Range 1:28 PM  2wk ago  3wk ago     LDH 125 - 245 U/L 211 216 285 (H)        Results for BOSTEN, NEWSTROM (MRN [id number]) as of 06/27/2014 15:24  Ref. Range 06/16/2014 02:57  Adamts 13 Activity Latest Range: 68-163 % Activity <3 (L)  ADAMTS 13 inhibitor 1.9 (high), normal <  0.4   RADIOGRAPHIC STUDIES: I have personally reviewed the radiological images as listed and agreed with the findings in the report. No results found.  ASSESSMENT & PLAN:  44 year old African-American male, without significant past medical history, who was diagnosed TTP in Dec 2015.   1. Acquired TTP (thrombotic thrombocytopenic purpura) -He had typical presentation of some cytopenia, hemolytic anemia, renal failure and neuro symptoms. His ADAMTS 13 activity was less than 3%, with high ADAMTS 13 inhibitor titer, which supports the diagnosis of acquired TTP. -We discussed this is likely autoimmune disease, it is possible he may have recurrent episodes in the future. If the second episodes happens, I would consider immunosuppressant therapy. -His plate count has recovered well and hold up well. His LDH level has been back to normal. He is going to taper off Prednisone in a Week. -Continue follow-up with his blood counts and LDH.  2. Renal insufficiency, resolved -This was related to his recent episode of rhabdomyolysis and TTP. -His creatinine is down to 1.2 now. -We discussed to avoid renal toxic medications, such as NSAIDs and CT IV contrast.  3. Generalized weakness, difficulty  concentration -This could be related to his steroids and recent prolonged hospital stay. -I encouraged him to be physically active, gradually increase his exercise level. -fall percussion discussed.   Follow-up: 3 months with lab CBC with differential and reticular count, CMP and LDH.   All questions were answered. The patient knows to call the clinic with any problems, questions or concerns.  I spent 20 minutes counseling the patient face to face. The total time spent in the appointment was 25 minutes and more than 50% was on counseling.     Truitt Merle, MD 07/23/2014 2:07 PM

## 2014-07-23 NOTE — Telephone Encounter (Signed)
Gave avs & cal for April.

## 2014-07-24 ENCOUNTER — Telehealth: Payer: Self-pay | Admitting: *Deleted

## 2014-07-24 NOTE — Telephone Encounter (Signed)
Received call from pt stating he had some concerns about the office note from Dr Burr Medico yest & would like some things corrected before he sends to his employer.  He reports that he had discussed symptoms of H/A, pain in back of eyes, mood swings, heart palpitations & dry patch of skin on his shoulder & these were not mentioned in her note. He states that she mentioned possible anxiety attack which wasn't discussed.  He would like an updated OV note so he can send to his employer.  His fax # is [phone].  Message to Dr Burr Medico.

## 2014-07-28 ENCOUNTER — Encounter: Payer: Self-pay | Admitting: Hematology

## 2014-07-28 ENCOUNTER — Telehealth: Payer: Self-pay | Admitting: *Deleted

## 2014-07-28 NOTE — Progress Notes (Signed)
Faxed clinical information to [place] @ [phone] to extend patient's disability

## 2014-07-28 NOTE — Telephone Encounter (Signed)
Faxed revised office notes to pt's fax number as per Dr. Ernestina Penna instructions.

## 2014-08-13 ENCOUNTER — Telehealth: Payer: Self-pay | Admitting: *Deleted

## 2014-08-13 ENCOUNTER — Other Ambulatory Visit: Payer: Self-pay | Admitting: *Deleted

## 2014-08-13 DIAGNOSIS — D509 Iron deficiency anemia, unspecified: Secondary | ICD-10-CM

## 2014-08-13 DIAGNOSIS — M3119 Other thrombotic microangiopathy: Secondary | ICD-10-CM

## 2014-08-13 DIAGNOSIS — D696 Thrombocytopenia, unspecified: Secondary | ICD-10-CM

## 2014-08-13 DIAGNOSIS — N179 Acute kidney failure, unspecified: Secondary | ICD-10-CM

## 2014-08-13 DIAGNOSIS — M311 Thrombotic microangiopathy: Secondary | ICD-10-CM

## 2014-08-13 MED ORDER — TRAMADOL HCL 50 MG PO TABS: 50.0000 mg | ORAL_TABLET | Freq: Three times a day (TID) | ORAL | Status: AC | PRN

## 2014-08-13 NOTE — Telephone Encounter (Signed)
Patient left voicemail stating he was having 8/10 back pain after stopping prednisone on 08/01/14. Patient has taken tylenol which does help the pain, but want to see if Dr. Burr Medico has other options for his back pain. Message forwarded to Dr. Burr Medico and nurse.

## 2014-10-24 ENCOUNTER — Ambulatory Visit (HOSPITAL_BASED_OUTPATIENT_CLINIC_OR_DEPARTMENT_OTHER): Payer: 59 | Admitting: Hematology

## 2014-10-24 ENCOUNTER — Other Ambulatory Visit (HOSPITAL_BASED_OUTPATIENT_CLINIC_OR_DEPARTMENT_OTHER): Payer: 59

## 2014-10-24 ENCOUNTER — Telehealth: Payer: Self-pay | Admitting: Hematology

## 2014-10-24 VITALS — BP 137/90 | HR 91 | Temp 98.8°F | Resp 18 | Ht 76.0 in | Wt 255.8 lb

## 2014-10-24 DIAGNOSIS — M3119 Other thrombotic microangiopathy: Secondary | ICD-10-CM

## 2014-10-24 DIAGNOSIS — N289 Disorder of kidney and ureter, unspecified: Secondary | ICD-10-CM | POA: Diagnosis not present

## 2014-10-24 DIAGNOSIS — M311 Thrombotic microangiopathy: Secondary | ICD-10-CM

## 2014-10-24 DIAGNOSIS — R531 Weakness: Secondary | ICD-10-CM | POA: Diagnosis not present

## 2014-10-24 LAB — COMPREHENSIVE METABOLIC PANEL (CC13)
ALBUMIN: 4.1 g/dL (ref 3.5–5.0)
ALK PHOS: 34 U/L — AB (ref 40–150)
ALT: 46 U/L (ref 0–55)
AST: 49 U/L — AB (ref 5–34)
Anion Gap: 10 mEq/L (ref 3–11)
BUN: 15.3 mg/dL (ref 7.0–26.0)
CHLORIDE: 106 meq/L (ref 98–109)
CO2: 27 mEq/L (ref 22–29)
CREATININE: 1.2 mg/dL (ref 0.7–1.3)
Calcium: 9.5 mg/dL (ref 8.4–10.4)
EGFR: 89 mL/min/{1.73_m2} — ABNORMAL LOW (ref >90)
GLUCOSE: 90 mg/dL (ref 70–140)
Potassium: 4.4 mEq/L (ref 3.5–5.1)
Sodium: 143 mEq/L (ref 136–145)
TOTAL PROTEIN: 7 g/dL (ref 6.4–8.3)
Total Bilirubin: 0.51 mg/dL (ref 0.20–1.20)

## 2014-10-24 LAB — CBC & DIFF AND RETIC
BASO%: 0.4 % (ref 0.0–2.0)
Basophils Absolute: 0 10*3/uL (ref 0.0–0.1)
EOS ABS: 0.1 10*3/uL (ref 0.0–0.5)
EOS%: 1.8 % (ref 0.0–7.0)
HEMATOCRIT: 43.7 % (ref 38.4–49.9)
HGB: 14.4 g/dL (ref 13.0–17.1)
Immature Retic Fract: 3.8 % (ref 3.00–10.60)
LYMPH%: 43.4 % (ref 14.0–49.0)
MCH: 27.2 pg (ref 27.2–33.4)
MCHC: 33 g/dL (ref 32.0–36.0)
MCV: 82.6 fL (ref 79.3–98.0)
MONO#: 0.5 10*3/uL (ref 0.1–0.9)
MONO%: 9.2 % (ref 0.0–14.0)
NEUT#: 2.2 10*3/uL (ref 1.5–6.5)
NEUT%: 45.2 % (ref 39.0–75.0)
Platelets: 201 10*3/uL (ref 140–400)
RBC: 5.29 10*6/uL (ref 4.20–5.82)
RDW: 13.2 % (ref 11.0–14.6)
RETIC CT ABS: 76.18 10*3/uL (ref 34.80–93.90)
Retic %: 1.44 % (ref 0.80–1.80)
WBC: 4.9 10*3/uL (ref 4.0–10.3)
lymph#: 2.1 10*3/uL (ref 0.9–3.3)

## 2014-10-24 LAB — LACTATE DEHYDROGENASE (CC13): LDH: 208 U/L (ref 125–245)

## 2014-10-24 NOTE — Telephone Encounter (Signed)
Gave and printed appt sched and avs to pt for July and OCT

## 2014-10-24 NOTE — Progress Notes (Signed)
[place]  Telephone:(336) [phone] Fax:(336) [phone]  Clinic New Consult Note   Patient Care Team: Nolene Ebbs, MD as PCP - General (Internal Medicine) 10/24/2014  CHIEF COMPLAINTS Follow up TTP   HISTORY OF INITIAL PRESENTING ILLNESS:  Kelly Bright 44 y.o. male  without a significant past medical history, who was recently diagnosed with TTP. I saw him when he was in the hospital, he is here for follow-up after hospital discharge.  He presented to the ED on 06/23/2014 with complaints of diffuse muscle pain, nausea and vomiting, blood blister in mouth,  and dark urine for one day. He was found to have rhabdomyolysis with a total CK of 5214, as well as an elevated BUN/Cr of 49/2.68, and incidentally was found to have a platelet count of 9. I was consulted when I was on call. His lab test results supports TTP, and I started him on plasma exchange the next day at [facility]. He responded well and a platelet of recovered after 4 sessions of plasma exchange (a total of 5 sessions). I also started him on supplemental 125 mg IV every 12 hours, and gradually decreased to 80 mg prednisone once daily when he was discharged home on 06/21/2014. His dialysis catheter was removed upon discharge.  INTERIM HISTORY: He returns for follow up. He is doing very well clinically. He is back to work full-time, and restarted exercise regularly. He has grate appetite and energy level, no headaches or other neurology symptoms. No fever or chills, weight is stable. He feels he is back to his normal baseline overall.  MEDICAL HISTORY:  None  SURGICAL HISTORYapp: Past Surgical History  Procedure Laterality Date  . Appendectomy      SOCIAL HISTORY: History   Social History  . Marital Status: Single    Spouse Name: N/A  . Number of Children: N/A  . Years of Education: N/A   Occupational History  . Not on file.   Social History Main Topics  . Smoking status: Never Smoker   .  Smokeless tobacco: Not on file  . Alcohol Use: No  . Drug Use: No  . Sexual Activity: Not on file   Other Topics Concern  . Not on file   Social History Narrative    FAMILY HISTORY: No family history of blood disorder or cancer. One of his cousins has lupus.  ALLERGIES:  is allergic to novocain.  MEDICATIONS:  Current Outpatient Prescriptions  Medication Sig Dispense Refill  . Ascorbic Acid (VITAMIN C) 500 MG CHEW Chew 1 tablet by mouth daily.    . cholecalciferol (VITAMIN D) 1000 UNITS tablet Take 1,000 Units by mouth daily.    . cyclobenzaprine (FLEXERIL) 10 MG tablet Take 10 mg by mouth every 8 (eight) hours as needed.    Marland Kitchen glucosamine-chondroitin 500-400 MG tablet Take 1 tablet by mouth 2 (two) times daily.    . traMADol (ULTRAM) 50 MG tablet Take 1 tablet (50 mg total) by mouth every 8 (eight) hours as needed for moderate pain. 30 tablet 0   No current facility-administered medications for this visit.    REVIEW OF SYSTEMS:   Constitutional: Denies fevers, chills or abnormal night sweats Eyes: Denies blurriness of vision, double vision or watery eyes Ears, nose, mouth, throat, and face: Denies mucositis or sore throat Respiratory: Denies cough, dyspnea or wheezes Cardiovascular: Denies palpitation, chest discomfort or lower extremity swelling Gastrointestinal:  Denies nausea, heartburn or change in bowel habits Skin: Denies abnormal skin rashes Lymphatics:  Denies new lymphadenopathy or easy bruising Neurological:Denies numbness, tingling or new weaknesses Behavioral/Psych: Mood is stable, no new changes  All other systems were reviewed with the patient and are negative.  PHYSICAL EXAMINATION: ECOG PERFORMANCE STATUS: 0  Filed Vitals:   10/24/14 1056  BP: 137/90  Pulse: 91  Temp: 98.8 F (37.1 C)  Resp: 18   Filed Weights   10/24/14 1056  Weight: 255 lb 12.8 oz (116.03 kg)    GENERAL:alert, no distress and comfortable SKIN: skin color, texture, turgor are  normal, no rashes or significant lesions EYES: normal, conjunctiva are pink and non-injected, sclera clear OROPHARYNX:no exudate, no erythema and lips, buccal mucosa, and tongue normal  NECK: supple, thyroid normal size, non-tender, without nodularity LYMPH:  no palpable lymphadenopathy in the cervical, axillary or inguinal LUNGS: clear to auscultation and percussion with normal breathing effort HEART: regular rate & rhythm and no murmurs and no lower extremity edema ABDOMEN:abdomen soft, non-tender and normal bowel sounds Musculoskeletal:no cyanosis of digits and no clubbing  PSYCH: alert & oriented x 3 with fluent speech NEURO: no focal motor/sensory deficits  LABORATORY DATA:  I have reviewed the data as listed CBC Latest Ref Rng 10/24/2014 07/23/2014 07/09/2014  WBC 4.0 - 10.3 10e3/uL 4.9 6.2 7.1  Hemoglobin 13.0 - 17.1 g/dL 14.4 13.9 12.0(L)  Hematocrit 38.4 - 49.9 % 43.7 42.8 37.6(L)  Platelets 140 - 400 10e3/uL 201 258 162    CMP Latest Ref Rng 07/23/2014 07/09/2014 06/27/2014  Glucose 70 - 140 mg/dl 84 78 83  BUN 7.0 - 26.0 mg/dL 18.1 21.3 21.7  Creatinine 0.7 - 1.3 mg/dL 1.2 1.2 1.4(H)  Sodium 136 - 145 mEq/L 142 142 143  Potassium 3.5 - 5.1 mEq/L 4.1 3.8 3.6  Chloride 96 - 112 mEq/L - - -  CO2 22 - 29 mEq/L 31(H) 29 31(H)  Calcium 8.4 - 10.4 mg/dL 10.2 9.8 9.3  Total Protein 6.4 - 8.3 g/dL 7.6 7.0 6.5  Total Bilirubin 0.20 - 1.20 mg/dL 0.41 0.39 0.46  Alkaline Phos 40 - 150 U/L 35(L) 36(L) 43  AST 5 - 34 U/L 29 20 19   ALT 0 - 55 U/L 51 37 32   Lactate dehydrogenase  Status: Finalresult Visible to patient:  Not Released Nextappt: 10/24/2014 at 10:15 AM in Oncology [facility] Lab 1)           Ref Range 1:28 PM  2wk ago  3wk ago     LDH 125 - 245 U/L 211 216 285 (H)        Results for SELAH, ZELMAN (MRN [id number]) as of 06/27/2014 15:24  Ref. Range 06/16/2014 02:57  Adamts 13 Activity Latest Range: 68-163 % Activity <3 (L)  ADAMTS 13 inhibitor  1.9 (high), normal <0.4   RADIOGRAPHIC STUDIES: I have personally reviewed the radiological images as listed and agreed with the findings in the report. No results found.  ASSESSMENT & PLAN:  44 year old African-American male, without significant past medical history, who was diagnosed TTP in Dec 2015.   1. Acquired TTP (thrombotic thrombocytopenic purpura) -He had typical presentation of some cytopenia, hemolytic anemia, renal failure and neuro symptoms. His ADAMTS 13 activity was less than 3%, with high ADAMTS 13 inhibitor titer, which supports the diagnosis of acquired TTP. -We discussed this is likely autoimmune disease, it is possible he may have recurrent episodes in the future. If the second episodes happens, I would consider immunosuppressant therapy. -His plate count has recovered well and hold up well. His LDH level  has been back to normal.  -Continue follow-up with his blood counts and LDH.  2. Renal insufficiency, resolved -This was related to his recent episode of rhabdomyolysis and TTP. -His creatinine is down to 1.2 now. -We discussed to avoid renal toxic medications, such as NSAIDs and CT IV contrast.  3. Generalized weakness, difficulty concentration -This is resolved now.  Follow-up: Return to clinic in 6 months, lab with a CBC and LDH in 3 and 6 months.  I spent 15 minutes counseling the patient face to face. The total time spent in the appointment was 20 minutes and more than 50% was on counseling.     Truitt Merle, MD 10/24/2014 11:11 AM

## 2014-10-25 ENCOUNTER — Encounter: Payer: Self-pay | Admitting: Hematology

## 2015-01-16 ENCOUNTER — Other Ambulatory Visit (HOSPITAL_BASED_OUTPATIENT_CLINIC_OR_DEPARTMENT_OTHER): Payer: 59

## 2015-01-16 DIAGNOSIS — M3119 Other thrombotic microangiopathy: Secondary | ICD-10-CM

## 2015-01-16 DIAGNOSIS — M311 Thrombotic microangiopathy: Secondary | ICD-10-CM | POA: Diagnosis not present

## 2015-01-16 LAB — CBC & DIFF AND RETIC
BASO%: 0.4 % (ref 0.0–2.0)
Basophils Absolute: 0 10*3/uL (ref 0.0–0.1)
EOS ABS: 0.1 10*3/uL (ref 0.0–0.5)
EOS%: 2.1 % (ref 0.0–7.0)
HCT: 42.2 % (ref 38.4–49.9)
HEMOGLOBIN: 13.8 g/dL (ref 13.0–17.1)
Immature Retic Fract: 4 % (ref 3.00–10.60)
LYMPH#: 2.3 10*3/uL (ref 0.9–3.3)
LYMPH%: 44.8 % (ref 14.0–49.0)
MCH: 26.7 pg — AB (ref 27.2–33.4)
MCHC: 32.7 g/dL (ref 32.0–36.0)
MCV: 81.8 fL (ref 79.3–98.0)
MONO#: 0.3 10*3/uL (ref 0.1–0.9)
MONO%: 6.4 % (ref 0.0–14.0)
NEUT%: 46.3 % (ref 39.0–75.0)
NEUTROS ABS: 2.4 10*3/uL (ref 1.5–6.5)
Platelets: 206 10*3/uL (ref 140–400)
RBC: 5.16 10*6/uL (ref 4.20–5.82)
RDW: 14 % (ref 11.0–14.6)
RETIC %: 1.52 % (ref 0.80–1.80)
RETIC CT ABS: 78.43 10*3/uL (ref 34.80–93.90)
WBC: 5.2 10*3/uL (ref 4.0–10.3)

## 2015-01-16 LAB — COMPREHENSIVE METABOLIC PANEL (CC13)
ALBUMIN: 4.1 g/dL (ref 3.5–5.0)
ALT: 23 U/L (ref 0–55)
AST: 27 U/L (ref 5–34)
Alkaline Phosphatase: 36 U/L — ABNORMAL LOW (ref 40–150)
Anion Gap: 10 mEq/L (ref 3–11)
BUN: 22 mg/dL (ref 7.0–26.0)
CALCIUM: 9.8 mg/dL (ref 8.4–10.4)
CHLORIDE: 105 meq/L (ref 98–109)
CO2: 27 meq/L (ref 22–29)
CREATININE: 1.1 mg/dL (ref 0.7–1.3)
GLUCOSE: 73 mg/dL (ref 70–140)
Potassium: 4.1 mEq/L (ref 3.5–5.1)
Sodium: 142 mEq/L (ref 136–145)
TOTAL PROTEIN: 7.1 g/dL (ref 6.4–8.3)
Total Bilirubin: 0.45 mg/dL (ref 0.20–1.20)

## 2015-01-16 LAB — LACTATE DEHYDROGENASE (CC13): LDH: 208 U/L (ref 125–245)

## 2015-04-24 ENCOUNTER — Encounter: Payer: Self-pay | Admitting: Hematology

## 2015-04-24 ENCOUNTER — Other Ambulatory Visit (HOSPITAL_BASED_OUTPATIENT_CLINIC_OR_DEPARTMENT_OTHER): Payer: 59

## 2015-04-24 ENCOUNTER — Ambulatory Visit (HOSPITAL_BASED_OUTPATIENT_CLINIC_OR_DEPARTMENT_OTHER): Payer: 59 | Admitting: Hematology

## 2015-04-24 ENCOUNTER — Telehealth: Payer: Self-pay | Admitting: Hematology

## 2015-04-24 VITALS — BP 135/75 | HR 78 | Temp 98.4°F | Resp 18 | Ht 76.0 in | Wt 256.3 lb

## 2015-04-24 DIAGNOSIS — N289 Disorder of kidney and ureter, unspecified: Secondary | ICD-10-CM

## 2015-04-24 DIAGNOSIS — M3119 Other thrombotic microangiopathy: Secondary | ICD-10-CM

## 2015-04-24 DIAGNOSIS — M311 Thrombotic microangiopathy: Secondary | ICD-10-CM

## 2015-04-24 LAB — CBC & DIFF AND RETIC
BASO%: 0.6 % (ref 0.0–2.0)
Basophils Absolute: 0 10*3/uL (ref 0.0–0.1)
EOS%: 2 % (ref 0.0–7.0)
Eosinophils Absolute: 0.1 10*3/uL (ref 0.0–0.5)
HCT: 43.2 % (ref 38.4–49.9)
HGB: 14.2 g/dL (ref 13.0–17.1)
IMMATURE RETIC FRACT: 5.7 % (ref 3.00–10.60)
LYMPH#: 2.2 10*3/uL (ref 0.9–3.3)
LYMPH%: 33.5 % (ref 14.0–49.0)
MCH: 26.9 pg — ABNORMAL LOW (ref 27.2–33.4)
MCHC: 32.9 g/dL (ref 32.0–36.0)
MCV: 82 fL (ref 79.3–98.0)
MONO#: 0.5 10*3/uL (ref 0.1–0.9)
MONO%: 7.1 % (ref 0.0–14.0)
NEUT#: 3.7 10*3/uL (ref 1.5–6.5)
NEUT%: 56.8 % (ref 39.0–75.0)
PLATELETS: 186 10*3/uL (ref 140–400)
RBC: 5.27 10*6/uL (ref 4.20–5.82)
RDW: 13.5 % (ref 11.0–14.6)
RETIC %: 1.44 % (ref 0.80–1.80)
RETIC CT ABS: 75.89 10*3/uL (ref 34.80–93.90)
WBC: 6.5 10*3/uL (ref 4.0–10.3)

## 2015-04-24 LAB — COMPREHENSIVE METABOLIC PANEL (CC13)
ALT: 17 U/L (ref 0–55)
AST: 21 U/L (ref 5–34)
Albumin: 4 g/dL (ref 3.5–5.0)
Alkaline Phosphatase: 37 U/L — ABNORMAL LOW (ref 40–150)
Anion Gap: 7 mEq/L (ref 3–11)
BILIRUBIN TOTAL: 0.48 mg/dL (ref 0.20–1.20)
BUN: 17.7 mg/dL (ref 7.0–26.0)
CHLORIDE: 104 meq/L (ref 98–109)
CO2: 31 meq/L — AB (ref 22–29)
CREATININE: 1.2 mg/dL (ref 0.7–1.3)
Calcium: 10.2 mg/dL (ref 8.4–10.4)
EGFR: 86 mL/min/{1.73_m2} — ABNORMAL LOW (ref >90)
GLUCOSE: 63 mg/dL — AB (ref 70–140)
Potassium: 4.3 mEq/L (ref 3.5–5.1)
SODIUM: 143 meq/L (ref 136–145)
TOTAL PROTEIN: 7.1 g/dL (ref 6.4–8.3)

## 2015-04-24 LAB — LACTATE DEHYDROGENASE (CC13): LDH: 176 U/L (ref 125–245)

## 2015-04-24 IMAGING — CR DG CHEST 1V PORT
1 series · 1 of 1 positions shown · non-contrast
Comparison: None.

CLINICAL DATA: Status post hemodialysis catheter insertion.

EXAM:
PORTABLE CHEST - 1 VIEW

[AP]
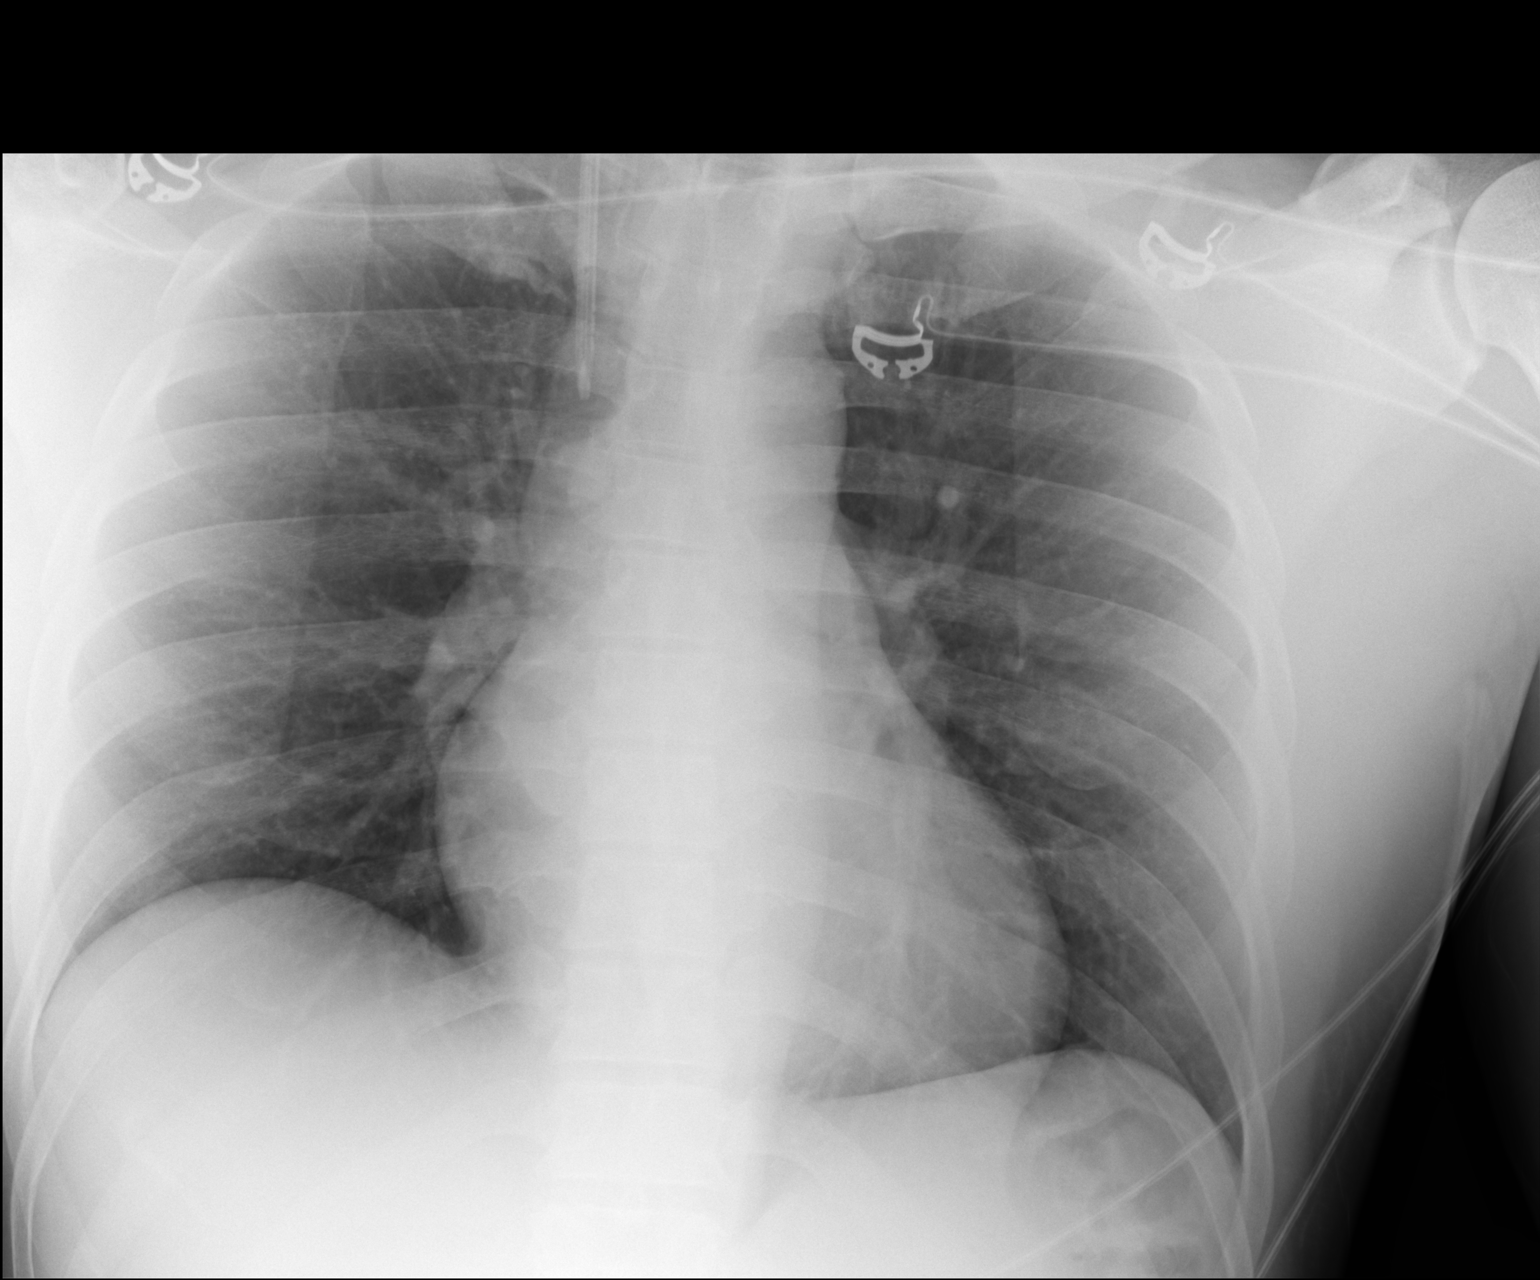

[1 of 1 positions shown; findings below may reference images not displayed]

FINDINGS: The patient's right IJ line is seen ending about the proximal SVC.

The lungs are well-aerated and clear. There is no evidence of focal
opacification, pleural effusion or pneumothorax.

The cardiomediastinal silhouette is within normal limits. No acute
osseous abnormalities are seen.
IMPRESSION: 1. Right IJ line noted ending about the proximal SVC.
2. No acute cardiopulmonary process seen.

## 2015-04-24 NOTE — Telephone Encounter (Signed)
Gave and printed appt sched and avs fo rpt for OCT 2017

## 2015-04-24 NOTE — Progress Notes (Signed)
[place]  [phone] [phone]) [phone]  Clinic follow up Note   Patient Care Team: Nolene Ebbs, MD as PCP - General (Internal Medicine) 04/24/2015  CHIEF COMPLAINTS Follow up TTP   HISTORY OF INITIAL PRESENTING ILLNESS:  Kelly Bright 44 y.o. male  without a significant past medical history, who was recently diagnosed with TTP. I saw him when he was in the hospital, he is here for follow-up after hospital discharge.  He presented to the ED on 06/23/2014 with complaints of diffuse muscle pain, nausea and vomiting, blood blister in mouth,  and dark urine for one day. He was found to have rhabdomyolysis with a total CK of 5214, as well as an elevated BUN/Cr of 49/2.68, and incidentally was found to have a platelet count of 9. I was consulted when I was on call. His lab test results supports TTP, and I started him on plasma exchange the next day at [facility]. He responded well and a platelet of recovered after 4 sessions of plasma exchange (a total of 5 sessions). I also started him on supplemental 125 mg IV every 12 hours, and gradually decreased to 80 mg prednisone once daily when he was discharged home on 06/21/2014. His dialysis catheter was removed upon discharge.  INTERIM HISTORY: He returns for follow up. He is clinically doing very well. His activity, memory, ability to concentrate and functional level has been back to his baseline. He has great appetite, exercise regularly, back to work full-time.  He has had some flu symptoms in the past few days, no fever or chills, no productive cough. He denies any episodes of bleeding, or other symptoms.   MEDICAL HISTORY:  None  SURGICAL HISTORYapp: Past Surgical History  Procedure Laterality Date  . Appendectomy      SOCIAL HISTORY: Social History   Social History  . Marital Status: Single    Spouse Name: N/A  . Number of Children: N/A  . Years of Education: N/A   Occupational History  .  Not on file.   Social History Main Topics  . Smoking status: Never Smoker   . Smokeless tobacco: Not on file  . Alcohol Use: No  . Drug Use: No  . Sexual Activity: Not on file   Other Topics Concern  . Not on file   Social History Narrative    FAMILY HISTORY: No family history of blood disorder or cancer. One of his cousins has lupus.  ALLERGIES:  is allergic to novocain.  MEDICATIONS:  Current Outpatient Prescriptions  Medication Sig Dispense Refill  . Ascorbic Acid (VITAMIN C) 500 MG CHEW Chew 1 tablet by mouth daily.    . cholecalciferol (VITAMIN D) 1000 UNITS tablet Take 1,000 Units by mouth daily.    . clindamycin (CLEOCIN T) 1 % external solution     . cyclobenzaprine (FLEXERIL) 10 MG tablet Take 10 mg by mouth every 8 (eight) hours as needed.    Marland Kitchen glucosamine-chondroitin 500-400 MG tablet Take 1 tablet by mouth 2 (two) times daily.    Marland Kitchen ketoconazole (NIZORAL) 2 % shampoo     . traMADol (ULTRAM) 50 MG tablet Take 1 tablet (50 mg total) by mouth every 8 (eight) hours as needed for moderate pain. 30 tablet 0  . tretinoin (RETIN-A) 0.025 % cream      No current facility-administered medications for this visit.    REVIEW OF SYSTEMS:   Constitutional: Denies fevers, chills or abnormal night sweats Eyes: Denies blurriness of vision,  double vision or watery eyes Ears, nose, mouth, throat, and face: Denies mucositis or sore throat Respiratory: Denies cough, dyspnea or wheezes Cardiovascular: Denies palpitation, chest discomfort or lower extremity swelling Gastrointestinal:  Denies nausea, heartburn or change in bowel habits Skin: Denies abnormal skin rashes Lymphatics: Denies new lymphadenopathy or easy bruising Neurological:Denies numbness, tingling or new weaknesses Behavioral/Psych: Mood is stable, no new changes  All other systems were reviewed with the patient and are negative.  PHYSICAL EXAMINATION: ECOG PERFORMANCE STATUS: 0  Filed Vitals:   04/24/15 1025    BP: 135/75  Pulse: 78  Temp: 98.4 F (36.9 C)  Resp: 18   Filed Weights   04/24/15 1025  Weight: 256 lb 4.8 oz (116.257 kg)    GENERAL:alert, no distress and comfortable SKIN: skin color, texture, turgor are normal, no rashes or significant lesions EYES: normal, conjunctiva are pink and non-injected, sclera clear OROPHARYNX:no exudate, no erythema and lips, buccal mucosa, and tongue normal  NECK: supple, thyroid normal size, non-tender, without nodularity LYMPH:  no palpable lymphadenopathy in the cervical, axillary or inguinal LUNGS: clear to auscultation and percussion with normal breathing effort HEART: regular rate & rhythm and no murmurs and no lower extremity edema ABDOMEN:abdomen soft, non-tender and normal bowel sounds Musculoskeletal:no cyanosis of digits and no clubbing  PSYCH: alert & oriented x 3 with fluent speech NEURO: no focal motor/sensory deficits  LABORATORY DATA:  I have reviewed the data as listed CBC Latest Ref Rng 04/24/2015 01/16/2015 10/24/2014  WBC 4.0 - 10.3 10e3/uL 6.5 5.2 4.9  Hemoglobin 13.0 - 17.1 g/dL 14.2 13.8 14.4  Hematocrit 38.4 - 49.9 % 43.2 42.2 43.7  Platelets 140 - 400 10e3/uL 186 206 201    CMP Latest Ref Rng 04/24/2015 01/16/2015 10/24/2014  Glucose 70 - 140 mg/dl 63(L) 73 90  BUN 7.0 - 26.0 mg/dL 17.7 22.0 15.3  Creatinine 0.7 - 1.3 mg/dL 1.2 1.1 1.2  Sodium 136 - 145 mEq/L 143 142 143  Potassium 3.5 - 5.1 mEq/L 4.3 4.1 4.4  Chloride 96 - 112 mEq/L - - -  CO2 22 - 29 mEq/L 31(H) 27 27  Calcium 8.4 - 10.4 mg/dL 10.2 9.8 9.5  Total Protein 6.4 - 8.3 g/dL 7.1 7.1 7.0  Total Bilirubin 0.20 - 1.20 mg/dL 0.48 0.45 0.51  Alkaline Phos 40 - 150 U/L 37(L) 36(L) 34(L)  AST 5 - 34 U/L 21 27 49(H)  ALT 0 - 55 U/L 17 23 46   Lactate dehydrogenase  Status: Finalresult Visible to patient:  Not Released Nextappt: 04/22/2016 at 09:45 AM in Oncology ([facility])           Ref Range 10:08 AM  678moago  612mogo  78m5moo      LDH 125 - 245 U/L 176 208 208 211         Results for [name], [name] [id number] of 06/27/2014 15:24  Ref. Range 06/16/2014 02:57  Adamts 13 Activity Latest Range: 68-163 % Activity <3 (L)  ADAMTS 13 inhibitor 1.9 (high), normal <0.4   RADIOGRAPHIC STUDIES: I have personally reviewed the radiological images as listed and agreed with the findings in the report. No results found.  ASSESSMENT & PLAN:  [age]ar old African-American male, without significant past medical history, who was diagnosed TTP in Dec 2015.   1. Acquired TTP (thrombotic thrombocytopenic purpura) -He had typical presentation of some cytopenia, hemolytic anemia, renal failure and neuro symptoms. His ADAMTS 13 activity was less than 3%, with high  ADAMTS 13 inhibitor titer, which supports the diagnosis of acquired TTP. -We discussed this is likely autoimmune disease, it is possible he may have recurrent episodes in the future. If the second episodes happens, I would consider immunosuppressant therapy. -He has been 10 months since his initial episode, he is clinically doing well, has not had any recurrence. -His plate count has recovered well and hold up well. His LDH level has been back to normal.  -Continue follow-up with his blood counts and LDH.  2. Renal insufficiency, resolved -This was related to his recent episode of rhabdomyolysis and TTP. -His creatinine is stable, 1.2 today. -We discussed to avoid renal toxic medications, such as NSAIDs and CT IV contrast.   Follow-up: Return to clinic with lab in one year. I encouraged him to find a primary care physician.   I spent 15 minutes counseling the patient face to face. The total time spent in the appointment was 20 minutes and more than 50% was on counseling.     Truitt Merle, MD 04/24/2015 8:45 PM

## 2016-04-22 ENCOUNTER — Encounter: Payer: 59 | Admitting: Hematology

## 2016-04-22 ENCOUNTER — Other Ambulatory Visit: Payer: 59

## 2016-04-22 NOTE — Progress Notes (Signed)
No show  This encounter was created in error - please disregard.

## 2019-03-05 ENCOUNTER — Other Ambulatory Visit: Payer: Self-pay

## 2019-03-05 DIAGNOSIS — Z20822 Contact with and (suspected) exposure to covid-19: Secondary | ICD-10-CM

## 2019-03-06 LAB — NOVEL CORONAVIRUS, NAA: SARS-CoV-2, NAA: NOT DETECTED

## 2019-05-03 ENCOUNTER — Other Ambulatory Visit: Payer: Self-pay

## 2019-05-03 DIAGNOSIS — Z20822 Contact with and (suspected) exposure to covid-19: Secondary | ICD-10-CM

## 2019-05-05 LAB — NOVEL CORONAVIRUS, NAA: SARS-CoV-2, NAA: NOT DETECTED

## 2019-07-09 ENCOUNTER — Ambulatory Visit: Payer: BC Managed Care – PPO | Attending: Internal Medicine

## 2019-07-09 DIAGNOSIS — Z20822 Contact with and (suspected) exposure to covid-19: Secondary | ICD-10-CM

## 2019-07-11 LAB — NOVEL CORONAVIRUS, NAA: SARS-CoV-2, NAA: NOT DETECTED

## 2019-10-11 ENCOUNTER — Ambulatory Visit: Payer: BC Managed Care – PPO | Attending: Internal Medicine

## 2019-10-11 DIAGNOSIS — Z20822 Contact with and (suspected) exposure to covid-19: Secondary | ICD-10-CM

## 2019-10-12 LAB — NOVEL CORONAVIRUS, NAA: SARS-CoV-2, NAA: NOT DETECTED

## 2019-10-12 LAB — SARS-COV-2, NAA 2 DAY TAT

## 2021-11-09 ENCOUNTER — Other Ambulatory Visit: Payer: Self-pay | Admitting: Internal Medicine

## 2021-11-10 LAB — COMPLETE METABOLIC PANEL WITH GFR
AG Ratio: 1.8 (calc) (ref 1.0–2.5)
ALT: 21 U/L (ref 9–46)
AST: 40 U/L (ref 10–40)
Albumin: 4.6 g/dL (ref 3.6–5.1)
Alkaline phosphatase (APISO): 29 U/L — ABNORMAL LOW (ref 36–130)
BUN: 18 mg/dL (ref 7–25)
CO2: 29 mmol/L (ref 20–32)
Calcium: 9.8 mg/dL (ref 8.6–10.3)
Chloride: 102 mmol/L (ref 98–110)
Creat: 1.19 mg/dL (ref 0.60–1.29)
Globulin: 2.5 g/dL (calc) (ref 1.9–3.7)
Glucose, Bld: 65 mg/dL (ref 65–99)
Potassium: 4.3 mmol/L (ref 3.5–5.3)
Sodium: 142 mmol/L (ref 135–146)
Total Bilirubin: 0.6 mg/dL (ref 0.2–1.2)
Total Protein: 7.1 g/dL (ref 6.1–8.1)
eGFR: 75 mL/min/{1.73_m2} (ref >=60)

## 2021-11-10 LAB — LIPID PANEL
Cholesterol: 180 mg/dL (ref <200)
HDL: 43 mg/dL (ref >=40)
LDL Cholesterol (Calc): 118 mg/dL (calc) — ABNORMAL HIGH
Non-HDL Cholesterol (Calc): 137 mg/dL (calc) — ABNORMAL HIGH (ref <130)
Total CHOL/HDL Ratio: 4.2 (calc) (ref <5.0)
Triglycerides: 86 mg/dL (ref <150)

## 2021-11-10 LAB — CBC
HCT: 44.6 % (ref 38.5–50.0)
Hemoglobin: 14.5 g/dL (ref 13.2–17.1)
MCH: 26.4 pg — ABNORMAL LOW (ref 27.0–33.0)
MCHC: 32.5 g/dL (ref 32.0–36.0)
MCV: 81.2 fL (ref 80.0–100.0)
MPV: 10.9 fL (ref 7.5–12.5)
Platelets: 243 10*3/uL (ref 140–400)
RBC: 5.49 10*6/uL (ref 4.20–5.80)
RDW: 13.5 % (ref 11.0–15.0)
WBC: 4.9 10*3/uL (ref 3.8–10.8)

## 2021-11-10 LAB — PSA: PSA: 2.32 ng/mL (ref <=4.00)

## 2021-11-10 LAB — VITAMIN D 25 HYDROXY (VIT D DEFICIENCY, FRACTURES): Vit D, 25-Hydroxy: 54 ng/mL (ref 30–100)

## 2021-11-10 LAB — TSH: TSH: 2.16 mIU/L (ref 0.40–4.50)

## 2023-02-14 ENCOUNTER — Other Ambulatory Visit: Payer: Self-pay | Admitting: Internal Medicine

## 2023-11-22 ENCOUNTER — Ambulatory Visit
Admission: EM | Admit: 2023-11-22 | Discharge: 2023-11-22 | Disposition: A | Discharge status: Home / Self Care | Attending: Emergency Medicine | Admitting: Emergency Medicine

## 2023-11-22 DIAGNOSIS — H6692 Otitis media, unspecified, left ear: Secondary | ICD-10-CM

## 2023-11-22 HISTORY — DX: Thrombocytopenia, unspecified: D69.6

## 2023-11-22 MED ORDER — AMOXICILLIN 875 MG PO TABS: 875.0000 mg | ORAL_TABLET | Freq: Two times a day (BID) | ORAL | 0 refills | Status: AC

## 2023-11-22 NOTE — ED Triage Notes (Signed)
 Patient to Urgent Care with complaints of left sided ear pain. Some headaches. Denies any fevers.   Symptoms  started on Friday. Now having pain on the right side.   Taking zyrtec.

## 2023-11-22 NOTE — Discharge Instructions (Addendum)
 Take the amoxicillin as directed.  Follow up with your primary care provider if your symptoms are not improving.

## 2023-11-22 NOTE — ED Provider Notes (Signed)
 [facility]    CSN: [id number] Arrival date & time: 11/22/23  1428      History   Chief Complaint Chief Complaint  Patient presents with   Otalgia    HPI Kelly Bright is a 53 y.o. male.  Patient presents with left ear pain x 1 week.  He also reports headache and has now developed some pain in the right ear as well.  No fever, ear drainage, sore throat, cough, shortness of breath.  Treatment attempted with Zyrtec.  The history is provided by the patient and medical records.    Past Medical History:  Diagnosis Date   Thrombocytopenic [facility])     Patient Active Problem List   Diagnosis Date Noted   Microcytic anemia    Thrombocytopenia (HCC) 06/15/2014   Rhabdomyolysis 06/15/2014   AKI (acute kidney injury) (HCC) 06/15/2014   TTP (thrombotic thrombocytopenic purpura) (HCC) 06/15/2014    Past Surgical History:  Procedure Laterality Date   APPENDECTOMY         Home Medications    Prior to Admission medications   Medication Sig Start Date End Date Taking? Authorizing Provider  amoxicillin (AMOXIL) 875 MG tablet Take 1 tablet (875 mg total) by mouth 2 (two) times daily for 10 days. 11/22/23 12/02/23 Yes Wellington Half, NP  Ascorbic Acid (VITAMIN C) 500 MG CHEW Chew 1 tablet by mouth daily.    [provider]  cholecalciferol (VITAMIN D ) 1000 UNITS tablet Take 1,000 Units by mouth daily.    [provider]  clindamycin (CLEOCIN T) 1 % external solution  04/01/15   [provider]  cyclobenzaprine (FLEXERIL) 10 MG tablet Take 10 mg by mouth every 8 (eight) hours as needed. 08/19/14   [provider]  glucosamine-chondroitin 500-400 MG tablet Take 1 tablet by mouth 2 (two) times daily.    [provider]  ketoconazole (NIZORAL) 2 % shampoo  04/01/15   [provider]  traMADol  (ULTRAM ) 50 MG tablet Take 1 tablet (50 mg total) by mouth every 8 (eight) hours as needed for moderate pain. Patient not taking: Reported on  11/22/2023 08/13/14   Sonja Seaton, MD  tretinoin (RETIN-A) 0.025 % cream  04/05/15   [provider]    Family History History reviewed. No pertinent family history.  Social History Social History   Tobacco Use   Smoking status: Never  Substance Use Topics   Alcohol use: No   Drug use: No     Allergies   Novocain [procaine]   Review of Systems Review of Systems  Constitutional:  Negative for chills and fever.  HENT:  Positive for ear pain. Negative for ear discharge and sore throat.   Respiratory:  Negative for cough and shortness of breath.   Neurological:  Positive for headaches.     Physical Exam Triage Vital Signs ED Triage Vitals  Encounter Vitals Group     BP 11/22/23 1445 134/86     Systolic BP Percentile --      Diastolic BP Percentile --      Pulse Rate 11/22/23 1445 62     Resp 11/22/23 1445 18     Temp 11/22/23 1445 98.7 F (37.1 C)     Temp src --      SpO2 11/22/23 1445 98 %     Weight --      Height --      Head Circumference --      Peak Flow --  Pain Score 11/22/23 1440 8     Pain Loc --      Pain Education --      Exclude from Growth Chart --    No data found.  Updated Vital Signs BP 134/86   Pulse 62   Temp 98.7 F (37.1 C)   Resp 18   SpO2 98%   Visual Acuity Right Eye Distance:   Left Eye Distance:   Bilateral Distance:    Right Eye Near:   Left Eye Near:    Bilateral Near:     Physical Exam Constitutional:      General: He is not in acute distress. HENT:     Right Ear: Tympanic membrane normal.     Left Ear: Tympanic membrane is erythematous.     Nose: Nose normal.     Mouth/Throat:     Mouth: Mucous membranes are moist.     Pharynx: Oropharynx is clear.  Cardiovascular:     Rate and Rhythm: Normal rate and regular rhythm.     Heart sounds: Normal heart sounds.  Pulmonary:     Effort: Pulmonary effort is normal. No respiratory distress.     Breath sounds: Normal breath sounds.  Neurological:      Mental Status: He is alert.      UC Treatments / Results  Labs (all labs ordered are listed, but only abnormal results are displayed) Labs Reviewed - No data to display  EKG   Radiology No results found.  Procedures Procedures (including critical care time)  Medications Ordered in UC Medications - No data to display  Initial Impression / Assessment and Plan / UC Course  I have reviewed the triage vital signs and the nursing notes.  Pertinent labs & imaging results that were available during my care of the patient were reviewed by me and considered in my medical decision making (see chart for details).    Left otitis media.  Afebrile and vital signs are stable.  Treating with amoxicillin.  Tylenol  or ibuprofen as needed.  Instructed patient to follow-up with his PCP if he is not improving.  He agrees to plan of care.  Final Clinical Impressions(s) / UC Diagnoses   Final diagnoses:  Left otitis media, unspecified otitis media type     Discharge Instructions      Take the amoxicillin as directed.  Follow-up with your primary care provider if your symptoms are not improving.      ED Prescriptions     Medication Sig Dispense Auth. Provider   amoxicillin (AMOXIL) 875 MG tablet Take 1 tablet (875 mg total) by mouth 2 (two) times daily for 10 days. 20 tablet Wellington Half, NP      PDMP not reviewed this encounter.   Wellington Half, NP 11/22/23 [phone]
